# Patient Record
Sex: Female | Born: 1980 | Race: White | Hispanic: No | Marital: Married | Smoking: Never smoker
Health system: Southern US, Community
[De-identification: ages and names within clinical notes are randomized; demographics above are authoritative.]

## PROBLEM LIST (undated history)

## (undated) DIAGNOSIS — K219 Gastro-esophageal reflux disease without esophagitis: Secondary | ICD-10-CM

## (undated) HISTORY — PX: CHOLECYSTECTOMY: SHX55

## (undated) HISTORY — PX: ABDOMINAL HYSTERECTOMY: SHX81

## (undated) HISTORY — DX: Gastro-esophageal reflux disease without esophagitis: K21.9

---

## 2002-10-13 ENCOUNTER — Encounter (INDEPENDENT_AMBULATORY_CARE_PROVIDER_SITE_OTHER): Payer: Self-pay | Admitting: *Deleted

## 2002-10-13 ENCOUNTER — Ambulatory Visit (HOSPITAL_BASED_OUTPATIENT_CLINIC_OR_DEPARTMENT_OTHER): Admission: RE | Admit: 2002-10-13 | Discharge: 2002-10-13 | Payer: Self-pay | Admitting: Otolaryngology

## 2002-10-21 ENCOUNTER — Ambulatory Visit (HOSPITAL_COMMUNITY): Admission: RE | Admit: 2002-10-21 | Discharge: 2002-10-21 | Payer: Self-pay | Admitting: Cardiovascular Disease

## 2002-11-13 ENCOUNTER — Encounter: Admission: RE | Admit: 2002-11-13 | Discharge: 2002-11-13 | Payer: Self-pay | Admitting: Cardiovascular Disease

## 2002-11-13 ENCOUNTER — Encounter: Payer: Self-pay | Admitting: Cardiovascular Disease

## 2003-02-16 ENCOUNTER — Encounter: Payer: Self-pay | Admitting: Orthopedic Surgery

## 2003-02-16 ENCOUNTER — Ambulatory Visit (HOSPITAL_COMMUNITY): Admission: RE | Admit: 2003-02-16 | Discharge: 2003-02-16 | Payer: Self-pay | Admitting: Orthopedic Surgery

## 2003-03-03 ENCOUNTER — Ambulatory Visit (HOSPITAL_BASED_OUTPATIENT_CLINIC_OR_DEPARTMENT_OTHER): Admission: RE | Admit: 2003-03-03 | Discharge: 2003-03-03 | Payer: Self-pay | Admitting: Orthopedic Surgery

## 2003-05-13 ENCOUNTER — Ambulatory Visit (HOSPITAL_BASED_OUTPATIENT_CLINIC_OR_DEPARTMENT_OTHER): Admission: RE | Admit: 2003-05-13 | Discharge: 2003-05-13 | Payer: Self-pay | Admitting: Otolaryngology

## 2003-05-13 ENCOUNTER — Ambulatory Visit (HOSPITAL_COMMUNITY): Admission: RE | Admit: 2003-05-13 | Discharge: 2003-05-13 | Payer: Self-pay | Admitting: Otolaryngology

## 2003-05-13 ENCOUNTER — Encounter (INDEPENDENT_AMBULATORY_CARE_PROVIDER_SITE_OTHER): Payer: Self-pay | Admitting: Specialist

## 2003-06-07 ENCOUNTER — Emergency Department (HOSPITAL_COMMUNITY): Admission: EM | Admit: 2003-06-07 | Discharge: 2003-06-08 | Payer: Self-pay | Admitting: Emergency Medicine

## 2004-08-28 ENCOUNTER — Ambulatory Visit (HOSPITAL_COMMUNITY): Admission: RE | Admit: 2004-08-28 | Discharge: 2004-08-28 | Payer: Self-pay | Admitting: Internal Medicine

## 2005-01-02 ENCOUNTER — Other Ambulatory Visit: Admission: RE | Admit: 2005-01-02 | Discharge: 2005-01-02 | Payer: Self-pay | Admitting: Obstetrics and Gynecology

## 2005-05-22 ENCOUNTER — Ambulatory Visit (HOSPITAL_COMMUNITY): Admission: RE | Admit: 2005-05-22 | Discharge: 2005-05-22 | Payer: Self-pay | Admitting: Obstetrics and Gynecology

## 2005-06-14 ENCOUNTER — Inpatient Hospital Stay (HOSPITAL_COMMUNITY): Admission: AD | Admit: 2005-06-14 | Discharge: 2005-06-14 | Payer: Self-pay | Admitting: *Deleted

## 2005-07-18 ENCOUNTER — Inpatient Hospital Stay (HOSPITAL_COMMUNITY): Admission: AD | Admit: 2005-07-18 | Discharge: 2005-07-23 | Payer: Self-pay | Admitting: Obstetrics and Gynecology

## 2005-07-19 ENCOUNTER — Inpatient Hospital Stay (HOSPITAL_COMMUNITY): Admission: AD | Admit: 2005-07-19 | Discharge: 2005-07-23 | Payer: Self-pay | Admitting: Obstetrics & Gynecology

## 2006-01-24 ENCOUNTER — Inpatient Hospital Stay (HOSPITAL_COMMUNITY): Admission: AD | Admit: 2006-01-24 | Discharge: 2006-01-27 | Payer: Self-pay | Admitting: Obstetrics and Gynecology

## 2006-01-26 ENCOUNTER — Encounter: Payer: Self-pay | Admitting: General Surgery

## 2006-01-26 ENCOUNTER — Encounter (INDEPENDENT_AMBULATORY_CARE_PROVIDER_SITE_OTHER): Payer: Self-pay | Admitting: *Deleted

## 2006-07-09 ENCOUNTER — Inpatient Hospital Stay (HOSPITAL_COMMUNITY): Admission: AD | Admit: 2006-07-09 | Discharge: 2006-07-09 | Payer: Self-pay | Admitting: Obstetrics and Gynecology

## 2006-07-10 ENCOUNTER — Ambulatory Visit (HOSPITAL_COMMUNITY): Admission: RE | Admit: 2006-07-10 | Discharge: 2006-07-10 | Payer: Self-pay | Admitting: Obstetrics and Gynecology

## 2006-07-23 ENCOUNTER — Inpatient Hospital Stay (HOSPITAL_COMMUNITY): Admission: AD | Admit: 2006-07-23 | Discharge: 2006-07-24 | Payer: Self-pay | Admitting: Obstetrics & Gynecology

## 2006-08-22 ENCOUNTER — Inpatient Hospital Stay (HOSPITAL_COMMUNITY): Admission: AD | Admit: 2006-08-22 | Discharge: 2006-08-24 | Payer: Self-pay | Admitting: Obstetrics & Gynecology

## 2006-10-25 ENCOUNTER — Emergency Department (HOSPITAL_COMMUNITY): Admission: EM | Admit: 2006-10-25 | Discharge: 2006-10-25 | Payer: Self-pay | Admitting: Emergency Medicine

## 2006-11-25 ENCOUNTER — Encounter: Admission: RE | Admit: 2006-11-25 | Discharge: 2006-11-25 | Payer: Self-pay | Admitting: Cardiovascular Disease

## 2006-12-11 ENCOUNTER — Emergency Department (HOSPITAL_COMMUNITY): Admission: EM | Admit: 2006-12-11 | Discharge: 2006-12-11 | Payer: Self-pay | Admitting: Emergency Medicine

## 2008-03-01 ENCOUNTER — Observation Stay (HOSPITAL_COMMUNITY): Admission: AD | Admit: 2008-03-01 | Discharge: 2008-03-04 | Payer: Self-pay | Admitting: Cardiovascular Disease

## 2008-07-01 ENCOUNTER — Emergency Department (HOSPITAL_COMMUNITY): Admission: EM | Admit: 2008-07-01 | Discharge: 2008-07-01 | Payer: Self-pay | Admitting: Emergency Medicine

## 2010-09-12 LAB — DIFFERENTIAL
Basophils Relative: 1 % (ref 0–1)
Eosinophils Absolute: 0.2 10*3/uL (ref 0.0–0.7)
Monocytes Relative: 8 % (ref 3–12)
Neutrophils Relative %: 67 % (ref 43–77)

## 2010-09-12 LAB — BASIC METABOLIC PANEL
BUN: 11 mg/dL (ref 6–23)
CO2: 26 mEq/L (ref 19–32)
Chloride: 104 mEq/L (ref 96–112)
Creatinine, Ser: 0.69 mg/dL (ref 0.4–1.2)
Glucose, Bld: 90 mg/dL (ref 70–99)

## 2010-09-12 LAB — CBC
MCHC: 34.2 g/dL (ref 30.0–36.0)
MCV: 84.4 fL (ref 78.0–100.0)
Platelets: 216 10*3/uL (ref 150–400)
RDW: 13 % (ref 11.5–15.5)

## 2010-09-12 LAB — D-DIMER, QUANTITATIVE: D-Dimer, Quant: 0.23 ug/mL-FEU (ref 0.00–0.48)

## 2010-10-10 NOTE — Consult Note (Signed)
NAMELARAH, KUNTZMAN               ACCOUNT NO.:  [id number]   MEDICAL RECORD NO.:  [id number]          PATIENT TYPE:  OBV   LOCATION:  5024                         FACILITY:  [facility]   PHYSICIAN:  Sandria Bales. Ezzard Standing, M.D.  DATE OF BIRTH:  06-19-1980   DATE OF CONSULTATION:  03/02/2008  DATE OF DISCHARGE:                                 CONSULTATION   REQUESTING PHYSICIAN:  Ricki Rodriguez, MD   CONSULTING SURGEON:  Sandria Bales. Ezzard Standing, MD   REASON FOR CONSULTATION:  Abscess, right medial forearm.   PRESENT ILLNESS:  Cynthia Mendez is a 30 year old otherwise healthy female  patient who presented to the ER with complaints of a 3- to 4-day history  of some sort of insect bite or abscess on her right forearm.  The area  started a kind of small like a pimple, was tiny and nodular in nature,  but increased in size with increasing pain and redness.  When the  redness began extending up towards her olecranon fossa, she felt she  needed to present to the ER and she was admitted by Medicine and started  empirically on antibiotic therapy with Rocephin.  Since admission, she  has noticed decrease redness especially with the cellulitic tracking up  to the olecranon process.  She still has a swollen and tender area,  which is raised on the right forearm.  Surgical consultation has been  requested.   REVIEW OF SYSTEMS:  As per the history of present illness.  SKIN:  The patient does have a history of boils in the subaxilla and  breast region in the past.   SOCIAL HISTORY:  Denies alcohol or tobacco.   PAST MEDICAL HISTORY:  Denies.   PAST SURGICAL HISTORY:  1. Cholecystectomy by Dr. Zachery Dakins in 2008.  2. Prior left knee surgery.  3. Prior tonsillectomy.  4. Prior deviated septum, nasal surgery.   ALLERGIES:  NKDA.   MEDICATIONS AT HOME:  None.  Since admission, the patient has been  started on the following medications.  1. IV Rocephin 1 g daily.  2. Ibuprofen for pain.  3. Protonix.  4. Vicodin  for pain.  5. Ambien for hours for insomnia.  6. She has also been given some supplemental potassium due to mild      hypokalemia upon presentation.   PHYSICAL EXAMINATION:  GENERAL:  Pleasant female patient complaining of  continued pain and swelling in the right forearm.  They are improved as  compared to prior to admission.  VITAL SIGNS:  Temp 97.6, BP 99/64, pulse 93 and regular, respirations  22.  PSYCH:  The patient is alert and oriented x3.  Her affect is appropriate  to current situation.  NEURO:  Cranial nerves II through XII are grossly intact.  She is moving  all extremities x4.  She is ambulatory.  CHEST:  Bilateral lung sounds are clear to auscultation.  Respiratory  effort is nonlabored.  She is on room air.  CARDIOVASCULAR:  Heart sounds are S1 and S2 without rubs, murmurs,  thrills, or gallops.  No JVD.  No peripheral edema.  Her  distal pulses  are intact.  Radial, femoral, and pedal including on the right forearm,  both ulnar and radial pulses are easily palpable, 2+.  ABDOMEN:  Benign, is soft.  Bowel sounds are present.  No organomegaly.  EXTREMITIES:  Symmetrical in appearance without cyanosis or clubbing.  SKIN:  She does have in the right medial forearm, a large bolus/boil-  type lesion that is below the skin.  This is circumferential in  appearance about 0.5-0.75 cm in height.  It is barely fluctuant, is red  and measures about 2 x 2 cm total diameter.  There is a tiny dark area  at the center.  No drainage.  She has a subtle area of raised erythema  surrounding the boil that actually extends out from the distal aspect of  the boil going medial towards the olecranon process.  This area is  estimated to be about 3 x 4 cm and is oval shaped.   LABORATORY DATA:  White count 8100, hemoglobin 13.8, platelets 220,000.  Sodium 135, potassium 3.4, CO2 26, glucose 114, BUN 12, creatinine 0.75.   IMPRESSION:  Right medial forearm abscess.   PLAN:  1. We will most  likely consider bedside I&D and culture.  I will defer      this to Dr. Ezzard Standing pending his examination.  2. She is clinically improving with medical therapy.  This may improve      without I&D, but given the size of the area, she again would      probably benefit from bedside I&D.  In the meantime, I will      continue empiric antibiotics, may wish to broaden coverage to cover      community-acquired MRSA such as doxycycline or Septra.      Cynthia Mendez, N.P.      Sandria Bales. Ezzard Standing, M.D.  Electronically Signed    ALE/MEDQ  D:  03/02/2008  T:  03/03/2008  Job:  [id number]   cc:   Ricki Rodriguez, M.D.

## 2010-10-10 NOTE — Discharge Summary (Signed)
NAMETRUDI, MORGENTHALER               ACCOUNT NO.:  [id number]   MEDICAL RECORD NO.:  [id number]          PATIENT TYPE:  OBV   LOCATION:  5024                         FACILITY:  [facility]   PHYSICIAN:  Ricki Rodriguez, M.D.  DATE OF BIRTH:  03-Jan-1981   DATE OF ADMISSION:  03/01/2008  DATE OF DISCHARGE:  03/04/2008                               DISCHARGE SUMMARY   FINAL DIAGNOSIS:  1. Right forearm abscess.  2. Possible insect bite.   DISCHARGE MEDICATIONS:  1. Doxycycline 100 mg one twice daily.  2. Percocet 5/325 mg one or two tablet every 4 hour as needed for      pain.  3. Ibuprofen 600 mg 3 times daily as needed for pain, may take in      addition to Percocet and always take with food or snack.   DISCHARGE DIET:  Low-sodium heart-healthy diet.   DISCHARGE ACTIVITY:  The patient to increase activity slowly.   WOUND CARE INSTRUCTION:  1. The patient to cover the wound with thin clean patch of oil      emulsion 2 times daily, warm compress or showers soaker 2 to 3      times daily.  2. Follow up by [facility] in 4 to 5 days.  The patient      to call [phone] for appointment.   HISTORY:  This 30 year old white female had possible insect bite 3 days  ago.  This was then followed by increasing redness, swelling in the  forearm, some of the redness and pain was following venous markings of  the forearm.  The patient does not have any past history of diabetes,  hypertension, smoking, or alcohol intake.   PHYSICAL EXAMINATION:  VITAL SIGNS:  Temperature 96, pulse 87,  respirations 16, blood pressure 106/80, height 5 feet 4 inch, and weight  159 pounds.  GENERAL:  The patient is alert and oriented x3.  She is averagely built  and nourished.  HEENT:  The patient is normocephalic and atraumatic.  Has blue eyes.  Pupils equally reactive to light.  Ear, nose, and throat grossly normal.  NECK:  No JVD, no carotid.  LUNGS:  Clear bilaterally.  HEART:  Normal S1 and S2.  No  murmur, gallop, or rub.  ABDOMEN:  Soft.  EXTREMITIES:  No edema, cyanosis, or clubbing.  However, had a 2.5 cm,  mid forearm, ventral, tender swelling with redness extending towards  elbow.  SKIN:  Warm and dry.  NEUROLOGICALLY:  The patient moves all four extremities and cranial  nerves are grossly intact.   LABORATORY DATA:  Revealed normal hemoglobin/hematocrit, WBC count, and  platelet count.  Electrolytes were near normal with a potassium of 3.4.  Blood cultures were pending.   HOSPITAL COURSE:  The patient was started on IV Rocephin following blood  cultures and surgical consult for Dr. Ezzard Standing was obtained.  The patient  underwent abscess incision and drainage as a bedside procedure.  The  patient had a mild elevation of temperature for 24 hours.  Doxycycline  100 mg IV q.12 h. was given.  Her condition  improved in 48-72 hours.  Daily dressing changes were done and the patient was afebrile on March 04, 2008, and discharged home in satisfactory condition with followup by  surgery in 4-5 days.      Ricki Rodriguez, M.D.  Electronically Signed     ASK/MEDQ  D:  03/04/2008  T:  03/05/2008  Job:  [id number]

## 2010-10-10 NOTE — Op Note (Signed)
NAMEMACAILA, TAHIR               ACCOUNT NO.:  [id number]   MEDICAL RECORD NO.:  [id number]          PATIENT TYPE:  OBV   LOCATION:  5024                         FACILITY:  [facility]   PHYSICIAN:  Sandria Bales. Ezzard Standing, M.D.  DATE OF BIRTH:  1981-01-12   DATE OF PROCEDURE:  03/02/2008  DATE OF DISCHARGE:                               OPERATIVE REPORT   Date of surgery ??   PREOPERATIVE DIAGNOSES:  Abscess, right forearm.   POSTOPERATIVE DIAGNOSES:  Abscess, right forearm.   PROCEDURE:  Incision and drainage of right forearm abscess.   ANESTHESIA:  Approximately 12 mL of 1% Xylocaine.   COMPLICATIONS:  None.   INDICATIONS FOR PROCEDURE:  Ms. Amrhein is a 30 year old white female who  has had an abscess come up on her right forearm and has tenderness,  swelling, and redness.  I plan to incise and drain this abscess at the  bedside and she is in the [facility] room 5024.   PROCEDURE NOTE:  The right arm was painted with Betadine solution,  sterile draped, infiltrate with about 12 mL of 1% Xylocaine.  I then did  an incision and drainage in to the abscess  and got some pus out that I  cultured.  I packed the wound with iodoform gauze.   The patient tolerated the procedure well.  We will start soaks to this  arm tomorrow.  She is already on antibiotics.      Sandria Bales. Ezzard Standing, M.D.  Electronically Signed     DHN/MEDQ  D:  03/02/2008  T:  03/03/2008  Job:  [id number]   cc:   Ricki Rodriguez, M.D.

## 2010-10-13 NOTE — Op Note (Signed)
NAME:  Cynthia Mendez, Cynthia Mendez                       ACCOUNT NO.:  [id number]   MEDICAL RECORD NO.:  [id number]                   PATIENT TYPE:  AMB   LOCATION:  DSC                                  FACILITY:  [facility]   PHYSICIAN:  Hermelinda Medicus, M.D.                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   OPERATION:  Septal reconstruction and turbinate reduction.   SURGEON:  Hermelinda Medicus, M.D.   ANESTHESIA:  General endotracheal by Dr. Gelene Mink   PROCEDURE:  The patient was placed in the supine position.  Under general  endotracheal anesthesia, the nose was also anesthetized with 1% Xylocaine  with epinephrine and topical cocaine 200 mg.  The inferior turbinates were  aggressively outfractured but no mucous membrane was removed gaining  considerable space.  A hemitransfixion incision was made on the left side,  carried back along the quadrilateral cartilage and the deviated  quadrilateral cartilage posteriorly and anterior septal deviation was  corrected by removing a strip of cartilaginous quadrilateral cartilage from  the posterior and from the floor of the nose.  A 4 mm chisel was used to  take down the premaxillary crest deviation and to bring that portion of the  septum back to the midline.  We then used the open and closed Laren Boom and [place] forceps to correct the ethmoid deviation that was to  the left.  Once this was brought under control, the septum was established  on the premaxillary crest of the midline and closure was begun using 5-0  plain catgut through and through septal suture using 4-0 plain x 2.  A nasal  pack was placed.   The patient tolerated the procedure well and is doing well  postoperatively.  Follow up will be tomorrow for packing removal and in one  week, three weeks, six weeks, and three months.                                               Hermelinda Medicus, M.D.    JC/MEDQ  D:  05/13/2003  T:  05/13/2003  Job:  [id number]   cc:   Ricki Rodriguez, M.D.  [address]  [place] [address]   Gabriel Earing, M.D.  [address]  [place]  [place] [address]  Fax: [phone]

## 2010-10-13 NOTE — H&P (Signed)
NAME:  Cynthia Mendez, Cynthia Mendez                       ACCOUNT NO.:  [id number]   MEDICAL RECORD NO.:  [id number]                   PATIENT TYPE:  AMB   LOCATION:  DSC                                  FACILITY:  [facility]   PHYSICIAN:  Hermelinda Medicus, M.D.                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  She  has had also persistent headache associated with this.  She is only 30 years  old, has been on a considerable amount of Duratuss and decongestant and her  only other medication is that of birth control pills.  She has no allergies  to medication and apparently has no allergies generally but she has entered  my office with considerable erythema of her nose with drainage down the back  of her posterior nasal pharynx into her throat which is bright red.  She, in  the past, has had similar problems and has been on Z-PAK.  Has also been on  Augmentin for two weeks trying to get over the infection after that and then  was on Ketek.  The headache problem was seen and a CT of the head was done  which was found to be within normal limits.  The sinuses appeared to be able  to be cleared as well as the mastoid appeared to be in good condition.  She  has also been on Prolex-PD, Tessalon Perles for cough and once we get her  over the infection is trying to keep her over.  On November 22 she finished  up her Ketek and by December she was back on Levaquin.  The only other  finding is that she has quite a severe septal deviation, has been unable to  breathe well through her nose.  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  She has also worked at [employer]  She has been associated with  several sick people and with people with pneumonia.   ALLERGIES:   No allergies to medications.   PAST SURGICAL HISTORY:  1. She had a tonsillectomy in March 2004.  2. Knee scope October 2004.   PAST MEDICAL HISTORY:  Otherwise, she is in perfectly good health except the  headache, the sinusitis, and the nasal obstruction.   PHYSICAL EXAMINATION:  GENERAL:  She is a well-nourished, well-developed  female in no acute distress.  VITAL SIGNS:  Blood pressure 108/68, pulse 61, weight 130.  HEENT:  Her ears are clear.  Tympanic membranes are clear.  Oral cavity is  clear.  Her nose shows a septal deviation to the left in the anterior  ethmoid/posterior ______ cartilage and it is off the premaxillary crest on  the left side of her nose.  Turbinates are quite large.  The oral cavity is  clear, free of any ulceration or mass.  Larynx, true vocal cords, false  cords, epiglottis, base of tongue are clear.  CHEST:  Clear.  No rales, rhonchi, or wheezes.  CARDIOVASCULAR:  Normal S1 and S2, no murmurs, rubs, or gallops.  ABDOMEN:  Unremarkable.  EXTREMITIES:  Unremarkable except for previous knee surgery.   INITIAL DIAGNOSES:  1. History sinusitis.  2. History persistent headaches.  3. History of repetitive sinusitis with nasal obstruction.  She now enters     for a septal reconstruction, turbinate reduction under general     endotracheal anesthesia.                                                Hermelinda Medicus, M.D.    JC/MEDQ  D:  05/13/2003  T:  05/13/2003  Job:  [id number]   cc:   Gabriel Earing, M.D.  [address]  [place]  [place] [address]  Fax: [phone]   Ricki Rodriguez, M.D.  [address]  [place] [address]

## 2010-10-13 NOTE — Op Note (Signed)
NAME:  Cynthia Mendez, Cynthia Mendez                       ACCOUNT NO.:  [id number]   MEDICAL RECORD NO.:  [id number]                   PATIENT TYPE:  AMB   LOCATION:  DSC                                  FACILITY:  [facility]   PHYSICIAN:  Jefry H. Pollyann Kennedy, M.D.                DATE OF BIRTH:  05-23-1981   DATE OF PROCEDURE:  10/13/2002  DATE OF DISCHARGE:                                 OPERATIVE REPORT   PREOPERATIVE DIAGNOSIS:  Chronic tonsillitis with a tonsil mass.   POSTOPERATIVE DIAGNOSIS:  Chronic tonsillitis with a tonsil mass.   PROCEDURE:  Tonsillectomy.   SURGEON:  Jefry H. Pollyann Kennedy, M.D.   ANESTHESIA:  General endotracheal anesthesia.   COMPLICATIONS:  None.   ESTIMATED BLOOD LOSS:  None.   FINDINGS:  Diffuse enlargement of the tonsils bilaterally with a  granulomatous-appearing lesion of the superior pole of the right tonsil.  The patient tolerated the procedure well, was awakened, extubated, and  transferred to recovery in stable condition.   REFERRING PHYSICIAN:  Ricki Rodriguez, M.D.   HISTORY:  This is a 30 year old with a history of chronic and recurring  tonsillitis.  The risks, benefits, alternatives, and complications of the  procedure were explained to the patient, who seemed to understand and agreed  to surgery.   DESCRIPTION OF PROCEDURE:  The patient was taken to the operating room and  placed on the operating table in supine position.  Following induction of  general endotracheal anesthesia, the Crowe-Davis mouth gag was inserted into  the oral cavity, used to retract the tongue and mandible, and attached to  the Mayo stand.  Inspection of the palate revealed no evidence of a short  palate or submucous cleft.  A red rubber catheter was inserted into the  right side of the nose, withdrawn through the mouth, and used to retract the  soft palate and uvula.  Indirect exam of the nasopharynx was performed.  There was no appreciable adenoid tissue present.  The tonsils  were removed  using electrocautery dissection, carefully dissecting the avascular plane  between the tonsil capsule and the constrictor muscles.  Spot cautery was  used as needed for small bleeders.  The tonsils were sent separately for  pathologic  evaluation because of the granulomatous lesion on the right side.  The  pharynx was suctioned of secretions and irrigated with saline and an  orogastric tube was used to aspirate the contents of the stomach.  The  patient was then awakened, extubated, and transferred to recovery in good  condition.                                               Jefry H. Pollyann Kennedy, M.D.    JHR/MEDQ  D:  10/13/2002  T:  10/13/2002  Job:  [id number]   cc:   Ricki Rodriguez, M.D.  [address]  [place] [address]

## 2010-10-13 NOTE — Op Note (Signed)
   NAME:  Cynthia Mendez, Cynthia Mendez                       ACCOUNT NO.:  [id number]   MEDICAL RECORD NO.:  [id number]                   PATIENT TYPE:  AMB   LOCATION:  DSC                                  FACILITY:  [facility]   PHYSICIAN:  Mila Homer. Sherlean Foot, M.D.              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   PROCEDURE:  Left knee partial medial meniscectomy and plica debridement.   INDICATION FOR PROCEDURE:  The patient is a 30 year old white female with  MRI evidence of a cartilage tear and symptomatic evidence of a plica.  Informed consent was obtained.   DESCRIPTION OF PROCEDURE:  The patient was laid supine and administered  general anesthesia.  The left lower extremity was prepped and draped in the  usual sterile fashion.  Inferolateral and inferomedial portals were created  with a #11 blade, blunt trocar, and cannula.  Diagnostic arthroscopy  revealed a normal patellofemoral joint other than a very hard and thickened  medial patellofemoral plica impinging through an arc of motion.  I used a  Automatic Data shaver to remove this plica.  I then went into flexion in  valgus.  There was a very obvious oblique posterior horn tear, which was  obviously the main source of her pain.  I used a Biochemist, clinical and  small basket forceps to perform an aggressive partial medial meniscectomy.  Once the cartilage was debrided back to a stable rim of tissue, I went and  checked the ACL and PCL, which were normal, the lateral compartment, which  was normal, and went back into the patellofemoral joint to ensure that the  plica was adequately debrided.  I then lavaged the knee, removed the fluid  and instruments, and closed with interrupted 4-0 nylon sutures,  dressed with  Adaptic, 4 x 4's, sterile Webril, and Ace wrap, and I did infiltrate the  portals with 5 mL of 0.5% Marcaine and morphine mixture in each portal.                                               Mila Homer. Sherlean Foot, M.D.    SDL/MEDQ  D:  03/03/2003  T:  03/03/2003  Job:  [id number]

## 2010-10-13 NOTE — Op Note (Signed)
NAME:  Cynthia Mendez, Cynthia Mendez NO.:  [id number]   MEDICAL RECORD NO.:  [id number]          PATIENT TYPE:  OUT   LOCATION:  DAY                          FACILITY:  [facility]   PHYSICIAN:  Anselm Pancoast. Weatherly, M.D.DATE OF BIRTH:  [birth year]   DATE OF PROCEDURE:  DATE OF DISCHARGE:  01/27/2006                                 OPERATIVE REPORT   PREOPERATIVE DIAGNOSES:  1. Chronic cholecystitis with possible recent passage of common bile duct      stone.  2. Intrauterine pregnancy.   POSTOPERATIVE DIAGNOSES:  1. Chronic cholecystitis with possible recent passage of common bile duct      stone.  2. Intrauterine pregnancy.   OPERATION PERFORMED:  Laparoscopic cholecystectomy with cholangiogram.   SURGEON:  Anselm Pancoast. Zachery Dakins, M.D.   ANESTHESIA:  General anesthesia.   HISTORY:  Cynthia Mendez is a 30 year old Caucasian female who was admitted  over at [facility] with epigastric and abdominal pain radiating to the  small of her back.  Her liver function studies were abnormal.  She is only  seven months postpartum, but she is now five weeks pregnant with a second  pregnancy.  An ultrasound showed numerous stones within her gallbladder.  The stone are small.  When she was originally seen at [facility] her liver  function studies were abnormal with an SGOT of 300 and SGOT __________  and  bilirubin was normal. The liver tests rose to an SGOT of 940 and an SGPT of  1,217.  An ultrasound was performed, which showed that the uterus has a  viable fetus.   The patient was seen by Dr. Bosie Clos of [facility] who felt that  with her symptoms she needed surgery consisting of a cholecystectomy.  She  may have passed the common bile duct stone since her liver function tests  appeared to be dropping; and that she did not need a preoperative endoscopic  retrograde cholangiopancreatography.  I was in agreement Dr. Marge Duncans  opinion.   Dr. Arlyce Dice saw the patient and she  is well aware that the surgery at this  time may cause her to have a miscarriage.  __________  with the acute  symptoms, etc we felt that we had no alternatives.   Arrangements were made to transfer her to [facility]; we will follow her in  the morning for the planned surgery.   DESCRIPTION OF THE OPERATION:  The patient was brought to surgery and  induction of general anesthesia was given.  The patient had been give  preoperative antibiotics.  The laparoscope, after prepping the abdomen, was  inserted through a small in below the umbilicus.  A purse-string suture had  been placed and the gallbladder was tight, but not acutely inflamed.  The  cystic duct was identified and dissected out.  A clip was placed distally  and a Cook catheter  __________ .  X-ray was obtained, which did show two  small stones in the distal common bile duct with good flow into the  duodenum.   The cystic artery was doubly clipped proximally, singly and distally  divided.  The gallbladder  was removed.  The clips had been placed on the  proximal cystic duct; and, no drains were placed.   The umbilical fascia was closed with an additional figure-of-eight of 0-  Vicryl in addition to the purse-string that had been placed.  The  subcutaneous branch was closed with 4-0 Vicryl.  Benzoin and Steri-strips  were placed on the skin.   DISCHARGE INSTRUCTIONS:  The patient, postoperatively, will have repeat  liver function studies in the morning.  I will talk with Dr. Bosie Clos. Thi  is certainly not a good time to have to do an endoscopic retrograde  cholangiopancreatography; and, the stones are small and hopefully they will  pass.   The patient tolerated the procedure nicely and will be kept this evening as  planned.           ______________________________  Anselm Pancoast. Zachery Dakins, M.D.     WJW/MEDQ  D:  02/20/2006  T:  02/22/2006  Job:  [id number]   cc:   Ilda Mori, M.D.  Fax: [phone]   Shirley Friar, MD  Fax: [phone]

## 2010-10-13 NOTE — Consult Note (Signed)
NAME:  Cynthia Mendez, Cynthia Mendez NO.:  [id number]   MEDICAL RECORD NO.:  [id number]          PATIENT TYPE:  INP   LOCATION:  9314                          [facility]ILITY[facility]  [facility]   PHYSICIAN:  Shirley Friar, MDDATE OF BIRTH:  Nov 08, 1980   DATE OF CONSULTATION:  DATE OF DISCHARGE:                                   CONSULTATION   PHYSICIAN CALLING FOR CONSULT:  Dr. Arlyce Dice   CHIEF COMPLAINT:  Right upper quadrant pain for the past 8 days.   HISTORY OF PRESENT ILLNESS:  The patient reports pain in the diaphragmatic  area anteriorly that radiates to the right back/shoulder area.  It started 1  week ago Thursday.  The pain awakens her at 4 a.m. in the morning and lasts  for hours.  She reports that she doubles over in pain.  Nothing she tries  relieves the pain.  She has tried Flexeril, Mylanta, Nexium, hot baths,  heating pads, changes in position, and nothing relieves the pain.  The pain  has occurred 5-6 nights in the past week to 8 days.  She has vomited twice.  She reports loose stools over the past week.  The patient also reports that  she felt a similar pain with her previous pregnancy that became severe just  before delivery.   PAST MEDICAL HISTORY:  She has sinusitis, has had a nasal obstruction.  She  had a premature delivery July 23, 1998.  She delivered at 38 weeks, a  female, and she delivered early because of preeclampsia.  Her blood type is  A negative.   SURGERIES:  Include a tonsillectomy in 2004 and arthroscope of the knee in  2004.   MEDICATIONS:  None at home.   ALLERGIES:  No known drug allergies.   SOCIAL:  She lives at home with her husband and infant daughter.   FAMILY HISTORY:  Significant for gallbladder disease in her mother, her  grandmother, and her aunt.   REVIEW OF SYSTEMS:  No headache, no swelling, no dyspnea or shortness of  breath, no anorexia.   PHYSICAL EXAMINATION:  GENERAL:  She is sitting up in bed.  She is alert and  oriented in no apparent distress.  She appears hydrated, well-nourished,  well-developed.  HEENT:  Eyes are PERRL.  They are anicteric.  Conjunctivae are pink.  NECK:  Supple with no thyromegaly, no adenopathy.  HEART:  Has a regular rate and rhythm with no murmurs, rubs, or gallops  appreciated.  LUNGS:  Clear to auscultation with no wheezes, crackles, or rales  appreciated.  ABDOMEN:  Soft, nontender, nondistended, with positive bowel sounds, no  bruits.  She has a negative Murphy's and negative peritoneal signs.  EXTREMITIES:  Show no cyanosis or edema.  VITAL SIGNS:  Temperature 97.7, pulse is 83, respirations are 20, and her  blood pressure is 109/65.   Her CBC showed a white count of 3.7, her hemoglobin is 12.1, hematocrit is  35.5, and platelets 267.  Chem 7 showed sodium 139, potassium 3.2, chloride  108, bicarb 28, BUN 1, creatinine 0.6, glucose is 93.  GI enzymes:  Her AST  is 946, her ALT is 1217, her alk phos is 96, her total bilirubin is 1, her  amylase is 68, lipase is 44.  Hepatitis B surface antigen is negative,  hepatitis C is negative, and the rest of an acute hepatitis panel is still  pending.  On ultrasound she had cholelithiasis.  The common bile duct was  2.2 mm which is within normal limits.   ASSESSMENT AND PLAN:  This is a 30 year old pregnant female with severe  episodic pain in the area of the diaphragm anteriorly radiating to the right  shoulder over 8 days.  The patient has elevated liver enzymes, vomiting, and  loose stools.  She does not have a rise in her amylase, she is afebrile, she  has no white count.  Common bile duct is 2.2 mm on ultrasound.  Ultrasound  shows cholelithiasis.  Her history is not consistent with an obstructing CBD  stone since there is no duct dilation and ALP is normal.  She may have  passed a stone leading to the sharp rise in AST/ALT but I think she needs to  have a cholecystectomy with intraoperative cholangiogram. If the IOC  is  positive then we would be happy to do an ERCP.  Will discuss this with the  surgeons and Dr. Arlyce Dice.   Thank you very much for this consultation.      Stephani Police, [place]      Shirley Friar, MD  Electronically Signed    MLY/MEDQ  D:  01/25/2006  T:  01/26/2006  Job:  [id number]   cc:   Carrington Clamp, M.D.  Fax: [phone]   Velora Heckler, MD  [phone] [address]  [place]  [place] [address]   Ilda Mori, M.D.  Fax: [phone]

## 2010-10-13 NOTE — Consult Note (Signed)
NAME:  Cynthia Mendez, Cynthia Mendez NO.:  [id number]   MEDICAL RECORD NO.:  [id number]          PATIENT TYPE:  INP   LOCATION:  9314                          FACILITY:  [facility]   PHYSICIAN:  Anselm Pancoast. Weatherly, M.D.DATE OF BIRTH:  07/28/1980   DATE OF CONSULTATION:  01/25/2006  DATE OF DISCHARGE:                                   CONSULTATION   CHIEF COMPLAINT:  Abdominal pain and abnormal liver tests.   HISTORY:  Cynthia Mendez is a 30 year old Caucasian female who was admitted  yesterday. She is now approximately 5-weeks pregnant. She is 7 months  postpartum. She was admitted with intermittent episodes of right upper  quadrant and epigastric pain. Her liver function studies were abnormal and  an ultrasound of the gallbladder was obtained which showed a very large  gallbladder with numerous small stones within it, the common bile duct was  not dilated and laboratory studies yesterday showed an SGOT of 350, an SGPT  of 153, alkaline phosphatase of 54 and a bilirubin 0.9. She was admitted and  a hepatitis panel was performed, it also did an amylase, I do not have the  results, but reportedly it was normal. Today I think her pain is actually  less. Her liver studies however showed an SGOT of 946, an SGPT of 1217 and a  white count was upper limits of normal yesterday is normal today at 3.7  thousand.  The patient has been seen by Dr. Arlyce Dice. Pelvic ultrasounds have  been performed and questioned whether she is threatening a miscarriage.  Dr.  Gerrit Friends was asked to see her, being on call, but he asked me to see her  because of his very busy schedule today and I agreed to see her.   PHYSICAL EXAMINATION:  GENERAL:  At this time she is a pleasant, slightly  overweight young female who is eating a clear liquid diet and greatly  desiring solid food. She says she still has kind of mild epigastric pain in  the right upper quadrant but she is not acutely tender, and she is also   afebrile.   Dr. Bosie Clos of [facility] had seen the patient and he left a  note stating that he does not feel she needs preoperative ERCP. She may have  passed a small common duct stone. Also in my opinion was this very dilated  gallbladder she has it may be extrinsic pressure from the dilated  gallbladder with a little bit of Mirizzi's type syndrome that is causing the  abnormal liver tests since her intrahepatic bile duct was not elevated on  the x-ray yesterday.   I would recommend, in that this is the third episode of pain that she had  over a period of approximately a month, that she is going to certainly need  a laparoscopic cholecystectomy and cholangiogram and the patient says she  desires to proceed with surgery at this admission and not try to postpone it  for approximately 6 weeks at which time she would hopefully be into the  second trimester. Dr. Arlyce Dice was present and he has discussed with her that  the ultrasound of her uterus does show possibly a threatened miscarriage but  she has not had any vaginal bleeding. The patient is scheduled for a CT and  quantitative in the morning, and he will make a note whether they are still  concerned about possibly having a miscarriage and feels that it would  probably be okay to proceed on with surgery at this time instead of waiting  until the second trimester.  The patient said she would greatly prefer  proceeding now, and her family members were present and I think understand  what we are talking about with the risk of surgery with a very early  pregnancy.  Dr. Arlyce Dice says he will write a note to the effect the course of  this surgery can be done at [facility]. We can shield the lower  abdomen at the time of her x-ray to make sure there will be no radiation  exposure to the fetus but the anesthetic risk would possibly cause a  miscarriage.  The patient is not allergic  to penicillin and we are going to proceed  with surgery tomorrow.  [facility] and probably keep her up there overnight and then release her  the following morning provided ERCP is not needed to be performed by Dr.  Bosie Clos or one of his associates.           ______________________________  Anselm Pancoast. Zachery Dakins, M.D.     WJW/MEDQ  D:  01/25/2006  T:  01/25/2006  Job:  [id number]

## 2010-10-13 NOTE — Discharge Summary (Signed)
NAME:  Cynthia Mendez, SHAKIR NO.:  [id number]   MEDICAL RECORD NO.:  [id number]          PATIENT TYPE:  INP   LOCATION:  9126                          FACILITY:  [facility]   PHYSICIAN:  Gerrit Friends. Aldona Bar, M.D.   DATE OF BIRTH:  October 30, 1980   DATE OF ADMISSION:  07/19/2005  DATE OF DISCHARGE:  07/23/2005                                 DISCHARGE SUMMARY   DISCHARGE DIAGNOSIS:  1.  36 week intrauterine pregnancy, delivered 5 pound 9 ounce female infant,      Apgars 07/09.  2.  Blood type A negative - RhoGAM given.  3.  Preeclampsia - the reason for induction of labor.   PROCEDURES:  1.  Induction of labor.  2.  Normal spontaneous delivery.   SUMMARY:  This patient, a 30 year old primigravida with a due date of March  23 was admitted for induction on July 19, 2005. She has had excess  weight gain, had proteinuria, had edema and her blood pressures were  elevated. She was admitted. Induction was begun with Cytotec and on the  morning of 07/20/2005 she was 4-5 cm dilated, vertex -2 station. She  subsequent delivered at about 11:00 a.m. on 07/20/2005. Mother was placed on  magnesium sulfate postpartum and transferred to the intensive care unit for  observation. Her blood pressures did well and after 24 hours magnesium  sulfate was discontinued. There was an episode of postpartum fever which was  treated with ampicillin and gentamicin but on the morning of 07/23/2005  mother was afebrile. Her blood pressure was 130/88. She was alert,  ambulating, bottle-feeding although her breasts were engorged, comfortable  and desirous of discharge. Accordingly she was given all appropriate  instructions and understood all instructions well. She was begun on  hydrochlorothiazide 25 milligrams every morning for the next week or two  because of continued edema and slightly elevated blood pressure - and she  was bottle feeding. She was also given prescriptions for Tylox use 1-2 every  4-6 hours  as needed for severe pain and Motrin 600 milligrams use every 6  hours as needed for cramping. She will return the office follow-up in  approximately one weeks' time to monitor her blood pressure and make a  decision about hydrochlorothiazide. She will continue on her prenatal  vitamins and she will add Feosol capsules one a day as her discharge  hemoglobin was 9.1 with a white count of 16,800 and platelet count of  186,000 (done on 07/22/2005).   CONDITION ON DISCHARGE:  Improved.      Gerrit Friends. Aldona Bar, M.D.  Electronically Signed     RMW/MEDQ  D:  07/23/2005  T:  07/23/2005  Job:  [id number]

## 2011-02-26 LAB — CULTURE, ROUTINE-ABSCESS: Gram Stain: NONE SEEN

## 2011-02-26 LAB — BASIC METABOLIC PANEL
CO2: 26
Calcium: 9
Creatinine, Ser: 0.75
GFR calc Af Amer: >60

## 2011-02-26 LAB — DIFFERENTIAL
Basophils Absolute: 0
Eosinophils Absolute: 0.1
Lymphocytes Relative: 24
Neutrophils Relative %: 48

## 2011-02-26 LAB — CBC
MCHC: 34.4
MCHC: 34.5
Platelets: 172
RBC: 4.79
RDW: 13.4

## 2011-02-26 LAB — CULTURE, BLOOD (ROUTINE X 2)

## 2011-03-12 LAB — DIFFERENTIAL
Basophils Relative: 0
Lymphs Abs: 1.9
Monocytes Relative: 8
Neutro Abs: 7.7
Neutrophils Relative %: 72

## 2011-03-12 LAB — D-DIMER, QUANTITATIVE: D-Dimer, Quant: 0.61 — ABNORMAL HIGH

## 2011-03-12 LAB — I-STAT 8, (EC8 V) (CONVERTED LAB)
BUN: 16
Glucose, Bld: 103 — ABNORMAL HIGH
Potassium: 3.7
TCO2: 25
pH, Ven: 7.398 — ABNORMAL HIGH

## 2011-03-12 LAB — POCT CARDIAC MARKERS
Myoglobin, poc: 30.1
Operator id: 282201
Troponin i, poc: <0.05

## 2011-03-12 LAB — CBC
RBC: 4.4
WBC: 10.7 — ABNORMAL HIGH

## 2011-03-12 LAB — POCT I-STAT CREATININE: Operator id: 282201

## 2014-11-30 ENCOUNTER — Emergency Department (HOSPITAL_COMMUNITY)
Admission: EM | Admit: 2014-11-30 | Discharge: 2014-11-30 | Disposition: A | Discharge status: Home / Self Care | Payer: BLUE CROSS/BLUE SHIELD | Attending: Emergency Medicine | Admitting: Emergency Medicine

## 2014-11-30 ENCOUNTER — Emergency Department (HOSPITAL_COMMUNITY): Payer: BLUE CROSS/BLUE SHIELD

## 2014-11-30 ENCOUNTER — Encounter (HOSPITAL_COMMUNITY): Payer: Self-pay | Admitting: *Deleted

## 2014-11-30 DIAGNOSIS — R1012 Left upper quadrant pain: Secondary | ICD-10-CM | POA: Diagnosis not present

## 2014-11-30 DIAGNOSIS — R112 Nausea with vomiting, unspecified: Secondary | ICD-10-CM | POA: Diagnosis not present

## 2014-11-30 DIAGNOSIS — Z79899 Other long term (current) drug therapy: Secondary | ICD-10-CM | POA: Diagnosis not present

## 2014-11-30 DIAGNOSIS — R079 Chest pain, unspecified: Secondary | ICD-10-CM | POA: Diagnosis not present

## 2014-11-30 LAB — CBC WITH DIFFERENTIAL/PLATELET
BASOS ABS: 0 10*3/uL (ref 0.0–0.1)
Basophils Relative: 0 % (ref 0–1)
EOS PCT: 14 % — AB (ref 0–5)
Eosinophils Absolute: 1 10*3/uL — ABNORMAL HIGH (ref 0.0–0.7)
HCT: 41.8 % (ref 36.0–46.0)
HEMOGLOBIN: 14 g/dL (ref 12.0–15.0)
LYMPHS ABS: 2.1 10*3/uL (ref 0.7–4.0)
Lymphocytes Relative: 28 % (ref 12–46)
MCH: 27.6 pg (ref 26.0–34.0)
MCHC: 33.5 g/dL (ref 30.0–36.0)
MCV: 82.4 fL (ref 78.0–100.0)
MONOS PCT: 11 % (ref 3–12)
Monocytes Absolute: 0.8 10*3/uL (ref 0.1–1.0)
Neutro Abs: 3.6 10*3/uL (ref 1.7–7.7)
Neutrophils Relative %: 47 % (ref 43–77)
Platelets: 281 10*3/uL (ref 150–400)
RBC: 5.07 MIL/uL (ref 3.87–5.11)
RDW: 13.2 % (ref 11.5–15.5)
WBC: 7.5 10*3/uL (ref 4.0–10.5)

## 2014-11-30 LAB — COMPREHENSIVE METABOLIC PANEL
ALT: 23 U/L (ref 14–54)
AST: 23 U/L (ref 15–41)
Albumin: 3.7 g/dL (ref 3.5–5.0)
Alkaline Phosphatase: 75 U/L (ref 38–126)
Anion gap: 8 (ref 5–15)
BUN: 9 mg/dL (ref 6–20)
CALCIUM: 8.9 mg/dL (ref 8.9–10.3)
CO2: 25 mmol/L (ref 22–32)
CREATININE: 0.83 mg/dL (ref 0.44–1.00)
Chloride: 105 mmol/L (ref 101–111)
GFR calc non Af Amer: >60 mL/min (ref >60)
GLUCOSE: 103 mg/dL — AB (ref 65–99)
Potassium: 4 mmol/L (ref 3.5–5.1)
Sodium: 138 mmol/L (ref 135–145)
Total Bilirubin: 0.4 mg/dL (ref 0.3–1.2)
Total Protein: 6.9 g/dL (ref 6.5–8.1)

## 2014-11-30 LAB — URINALYSIS, ROUTINE W REFLEX MICROSCOPIC
BILIRUBIN URINE: NEGATIVE
Glucose, UA: NEGATIVE mg/dL
HGB URINE DIPSTICK: NEGATIVE
Ketones, ur: NEGATIVE mg/dL
Leukocytes, UA: NEGATIVE
Nitrite: NEGATIVE
PROTEIN: NEGATIVE mg/dL
SPECIFIC GRAVITY, URINE: 1.02 (ref 1.005–1.030)
Urobilinogen, UA: 0.2 mg/dL (ref 0.0–1.0)
pH: 8.5 — ABNORMAL HIGH (ref 5.0–8.0)

## 2014-11-30 LAB — POC OCCULT BLOOD, ED: Fecal Occult Bld: NEGATIVE

## 2014-11-30 LAB — URINE MICROSCOPIC-ADD ON

## 2014-11-30 LAB — I-STAT TROPONIN, ED: Troponin i, poc: 0 ng/mL (ref 0.00–0.08)

## 2014-11-30 LAB — LIPASE, BLOOD: Lipase: 35 U/L (ref 22–51)

## 2014-11-30 MED ORDER — MORPHINE SULFATE 4 MG/ML IJ SOLN
4.0000 mg | Freq: Once | INTRAMUSCULAR | Status: AC
  Administered 2014-11-30: 4 mg via INTRAVENOUS
  Filled 2014-11-30: qty 1

## 2014-11-30 MED ORDER — OXYCODONE-ACETAMINOPHEN 5-325 MG PO TABS
ORAL_TABLET | ORAL | Status: AC
  Filled 2014-11-30: qty 1

## 2014-11-30 MED ORDER — ONDANSETRON HCL 4 MG/2ML IJ SOLN
4.0000 mg | Freq: Once | INTRAMUSCULAR | Status: AC
  Administered 2014-11-30: 4 mg via INTRAVENOUS
  Filled 2014-11-30: qty 2

## 2014-11-30 MED ORDER — PANTOPRAZOLE SODIUM 40 MG IV SOLR
80.0000 mg | Freq: Once | INTRAVENOUS | Status: AC
  Administered 2014-11-30: 80 mg via INTRAVENOUS
  Filled 2014-11-30: qty 80

## 2014-11-30 MED ORDER — PANTOPRAZOLE SODIUM 20 MG PO TBEC: 20.0000 mg | DELAYED_RELEASE_TABLET | Freq: Every day | ORAL | Status: DC

## 2014-11-30 MED ORDER — IOHEXOL 300 MG/ML  SOLN
100.0000 mL | Freq: Once | INTRAMUSCULAR | Status: AC | PRN
  Administered 2014-11-30: 100 mL via INTRAVENOUS

## 2014-11-30 MED ORDER — GI COCKTAIL ~~LOC~~
30.0000 mL | Freq: Once | ORAL | Status: AC
  Administered 2014-11-30: 30 mL via ORAL
  Filled 2014-11-30: qty 30

## 2014-11-30 MED ORDER — OXYCODONE-ACETAMINOPHEN 5-325 MG PO TABS
1.0000 | ORAL_TABLET | Freq: Once | ORAL | Status: AC
Start: 2014-11-30 — End: 2014-11-30
  Administered 2014-11-30: 1 via ORAL

## 2014-11-30 MED ORDER — SUCRALFATE 1 G PO TABS: 1.0000 g | ORAL_TABLET | Freq: Three times a day (TID) | ORAL | Status: DC

## 2014-11-30 MED ORDER — IOHEXOL 300 MG/ML  SOLN: 25.0000 mL | Freq: Once | INTRAMUSCULAR | Status: DC | PRN

## 2014-11-30 MED ORDER — HYDROMORPHONE HCL 1 MG/ML IJ SOLN
1.0000 mg | Freq: Once | INTRAMUSCULAR | Status: AC
  Administered 2014-11-30: 1 mg via INTRAVENOUS
  Filled 2014-11-30: qty 1

## 2014-11-30 NOTE — ED Notes (Signed)
Ambulated to restroom  

## 2014-11-30 NOTE — Discharge Instructions (Signed)
1. Medications: Carafate, Protonix, usual home medications 2. Treatment: rest, drink plenty of fluids, advance diet slowly, discontinue omeprazole, begin Protonix 3. Follow Up: Please followup with your primary doctor in 2 days for discussion of your diagnoses and further evaluation after today's visit; if you do not have a primary care doctor use the resource guide provided to find one; Please return to the ER for persistent vomiting, high fevers or worsening symptoms; please also follow with gastroenterology for further evaluation of your abdominal pain.    Abdominal Pain Many things can cause abdominal pain. Usually, abdominal pain is not caused by a disease and will improve without treatment. It can often be observed and treated at home. Your health care provider will do a physical exam and possibly order blood tests and X-rays to help determine the seriousness of your pain. However, in many cases, more time must pass before a clear cause of the pain can be found. Before that point, your health care provider may not know if you need more testing or further treatment. HOME CARE INSTRUCTIONS  Monitor your abdominal pain for any changes. The following actions may help to alleviate any discomfort you are experiencing:  Only take over-the-counter or prescription medicines as directed by your health care provider.  Do not take laxatives unless directed to do so by your health care provider.  Try a clear liquid diet (broth, tea, or water) as directed by your health care provider. Slowly move to a bland diet as tolerated. SEEK MEDICAL CARE IF:  You have unexplained abdominal pain.  You have abdominal pain associated with nausea or diarrhea.  You have pain when you urinate or have a bowel movement.  You experience abdominal pain that wakes you in the night.  You have abdominal pain that is worsened or improved by eating food.  You have abdominal pain that is worsened with eating fatty  foods.  You have a fever. SEEK IMMEDIATE MEDICAL CARE IF:   Your pain does not go away within 2 hours.  You keep throwing up (vomiting).  Your pain is felt only in portions of the abdomen, such as the right side or the left lower portion of the abdomen.  You pass bloody or black tarry stools. MAKE SURE YOU:  Understand these instructions.   Will watch your condition.   Will get help right away if you are not doing well or get worse.  Document Released: 02/21/2005 Document Revised: 05/19/2013 Document Reviewed: 01/21/2013 [facility] Patient Information 2015 [employer] [place]. This information is not intended to replace advice given to you by your health care provider. Make sure you discuss any questions you have with your health care provider.    Emergency Department Resource Guide 1) Find a Doctor and Pay Out of Pocket Although you won't have to find out who is covered by your insurance plan, it is a good idea to ask around and get recommendations. You will then need to call the office and see if the doctor you have chosen will accept you as a new patient and what types of options they offer for patients who are self-pay. Some doctors offer discounts or will set up payment plans for their patients who do not have insurance, but you will need to ask so you aren't surprised when you get to your appointment.  2) Contact Your Local Health Department Not all health departments have doctors that can see patients for sick visits, but many do, so it is worth a call to see  if yours does. If you don't know where your local health department is, you can check in your phone book. The CDC also has a tool to help you locate your state's health department, and many state websites also have listings of all of their local health departments.  3) Find a Walk-in Clinic If your illness is not likely to be very severe or complicated, you may want to try a walk in clinic. These are popping up all over the  country in pharmacies, drugstores, and shopping centers. They're usually staffed by nurse practitioners or physician assistants that have been trained to treat common illnesses and complaints. They're usually fairly quick and inexpensive. However, if you have serious medical issues or chronic medical problems, these are probably not your best option.  No Primary Care Doctor: - Call Health Connect at  [phone] - they can help you locate a primary care doctor that  accepts your insurance, provides certain services, etc. - Physician Referral Service- [phone]  Chronic Pain Problems: Organization         Address  Phone   Notes  [facility]  [phone] Patients need to be referred by their primary care doctor.   Medication Assistance: Organization         Address  Phone   Notes  [facility] Medication [facility] [address] [place] [place], [place] [address] [phone] --Must be a resident of [facility] -- Must have NO insurance coverage whatsoever (no Medicaid/ Medicare, etc.) -- The pt. MUST have a primary care doctor that directs their care regularly and follows them in the community   [facility]  [phone]   [employer]  [phone]    Agencies that provide inexpensive medical care: Organization         Address  Phone   Notes  [facility]  [phone]   [facility]    [phone]   [facility] [address] [place], [place] [address] [phone]   [facility] [address], [place] [phone]   [facility]anned Parenthood    [phone]   [facility]    [phone]   Community Health and [facility]  [address], [place] Phone:  [phone], Fax:  [phone] Hours of Operation:  9 am - 6 pm, M-F.  Also accepts Medicaid/Medicare and self-pay.  [facility]  [address], [place] Phone: [phone], Fax: [phone]. Hours of Operation:  8:30 am - 5:30 pm, M-F.  Also accepts Medicaid and self-pay.  [facility] [place] [address], [place] Phone: [phone]   [facility] [address] Natasha Bence [place], [place] [phone], Ext. [phone] Mondays & Thursdays: 7-9 AM.  First 15 patients are seen on a first come, first serve basis.    Medicaid-accepting [facility] Providers:  Organization         Address  Phone   Notes  [facility] [address], [place] [phone] Also accepts self-pay patients.  [facility] [address] Laurell Josephs [place], [place]  [phone]   [facility] [address], [place] [phone]   [facility] [address], [place] [phone]   Renaye Rakers [address], [place]   [phone] Only accepts [place] Access Medicaid patients after they have their name applied to their card.   Self-Pay (no insurance) in [facility]:  Organization         Address  Phone   Notes  Sickle Cell Patients, [facility] [address] [place], [place] [phone]   [facility] [address] [place], [place] [phone]   [facility] [place]  [address], [place] [phone]   [facility]/Dr. Osei-Bonsu  [address] [place] or [address], [place] [phone] Phone number for both [place] and [place] locations is the same.  Urgent Medical and [facility] [address] [place] [phone]   [facility] [address], [place] or [address] [phone]   [facility] [address], [place] [phone], phone; [phone], fax Sees patients 1st and 3rd Saturday of every month.  Must  not qualify for public or private insurance (i.e. Medicaid, Medicare, [place] Health Choice, Veterans' Benefits)  Household income should be no more than 200% of the poverty level The clinic cannot treat you if you are pregnant or think you are pregnant  Sexually transmitted diseases are not treated at the clinic.    Dental Care: Organization         Address  Phone  Notes  [facility] [address] [place], [place] [phone] Accepts children up to age 33 who are enrolled in [place] or [place] Health Choice; pregnant women with a Medicaid card; and children who have applied for Medicaid or [place] Health Choice, but were declined, whose parents can pay a reduced fee at time of service.  [facility] of [facility]  [address], [place] [phone] Accepts children up to age 9 who are enrolled in [place] or [place] Health Choice; pregnant women with a Medicaid card; and children who have applied for Medicaid or [place] Health Choice, but were declined, whose parents can pay a reduced fee at time of service.  [facility]  [address] [place], [place] [phone] Patients are seen by appointment only. Walk-ins are not accepted. [facility] will see patients 56 years of age and older. Monday - Tuesday (8am-5pm) Most Wednesdays (8:30-5pm) $30 per visit, cash only  [facility]  [address], [facility] [phone] Patients are seen by appointment only. Walk-ins are not accepted. [facility] will see patients 46 years of age and older. One Wednesday Evening (Monthly: Volunteer Based).  $30 per visit, cash only  [employer]  [phone] for adults; Children under age 29, call Graduate Pediatric Dentistry at [phone]. Children aged 43-14, please call [phone] to request a pediatric application.  Dental services are  provided in all areas of dental care including fillings, crowns and bridges, complete and partial dentures, implants, gum treatment, root canals, and extractions. Preventive care is also provided. Treatment is provided to both adults and children. Patients are selected via a lottery and there is often a waiting list.   [facility] [address] [place]  [phone] [email or web address]   [facility] [address] [place], [place] [phone], Ext. [phone] Second and Fourth Thursday of each month, opens at 6:30  AM; Clinic ends at 9 AM.  Patients are seen on a first-come first-served basis, and a limited number are seen during each clinic.   [facility]  [address] Ether Griffins [place], [place] [phone]   Eligibility Requirements You must have lived in [place], [place], or [place] counties for at least the last three months.   You cannot be eligible for state or federal sponsored [place], including [employer], [place], or [employer].   You generally cannot be eligible for healthcare insurance through your employer.    How to apply: Eligibility screenings are held every Tuesday and Wednesday afternoon from 1:00 pm until 4:00 pm. You do not need an appointment for the interview!  [facility] [address] [place], [place] [phone]   [facility] Health Department  [phone]   [facility] Health Department  [phone]   [facility] Health Department  [phone]    Behavioral Health Resources in the Community: Intensive Outpatient Programs Organization         Address  Phone  Notes  [facility] [address] [place], [place] [phone]   [facility] [address], [place], [place] [phone]   [facility] [address], [place], [place]  [phone]   [facility] [address]  [place], [place]  [phone] or [phone]   Substance Abuse Resources Organization         Address  Phone  Notes  [facility]  [phone]   [facility]  [phone]   [facility]  [phone]   Floydene Flock  [phone]   Residential & Outpatient Substance Abuse Program  [phone]   Psychological Services Organization         Address  Phone  Notes  [facility] Behavioral Health  [phone]   [facility]  [phone]   [facility] [address] [place] [phone] or [phone]    Mobile Crisis Teams Organization         Address  Phone  Notes  [facility], Mobile Crisis Care Unit  [phone]   [facility]  [address] [place], [place] [phone]   Doristine Locks [address] [place] [phone]    Self-Help/Support Groups Organization         Address  Phone             Notes  [facility] [place] - variety of support groups  336- I7437963 Call for more information  Narcotics Anonymous (NA), [facility] [address], [employer] [place]  2 meetings at this location   Statistician         Address  Phone  Notes  [facility] [address],    [place]  [phone]   [facility]  [address] [place] [address], [place], [place] [phone]   [facility] [address] [place], [place] [phone] Admissions: 8am-3pm M-F  [facility] [address]    [place], [place] [phone]   [facility] [address] Starling Manns [place], [place] [phone]   [facility] [place] [facility] [address]  [place], [place] [phone]   [facility] - Intensive Outpatient [address] Laurell Josephs [address], [place], [place] [phone]   [facility] ([facility] [address] [place], [place] [phone] or  [phone]  [facility] ([facility]) [address], [place], [place] [phone] Accepts Medicaid  [facility] [address]  [place] [phone] Substance Abuse/Addiction Treatment   [facility] Organization         Address  Phone  Notes  [facility]  (727) [phone]   Angie Fava, PhD [address] Ervin Knack [place], [place]   [phone] or [phone]   [facility]   [address] [place], [place] [phone]   [facility] [address] [place], [place] [phone] Insurance/Medicaid/sponsorship through [facility] and Families [address]                                    [place], [place] [phone] Therapy/tele-psych/case  [facility] [address], [place] [phone]    Dr. Lolly Mustache  [phone]   [facility] [place]  [employer] [facility] Dept. 1) [address] [place] 2) [address] [place] 3)  [address], [place] [phone]  [phone]   [facility] Child Abuse Hotline (201)100-4376 or [phone] (After Hours)

## 2014-11-30 NOTE — ED Notes (Signed)
Upper left abd pain for 1 week, sts us today by PMD and worsening pain since then. sts no help with medications taken.

## 2014-11-30 NOTE — ED Notes (Signed)
Pt sates that she has had LUQabdominal pain, chest pain and n/v. Pt also reports dark "tarry" stools and feeling bloated. Pt reports having ultrasound today but does not have the results yet.

## 2014-11-30 NOTE — ED Notes (Signed)
Also reports frequent bowel mvmets, solid but tarry.

## 2014-11-30 NOTE — ED Provider Notes (Signed)
CSN: [id number]     Arrival date & time 11/30/14  1804 History   First MD Initiated Contact with Patient 11/30/14 1928     Chief Complaint  Patient presents with  . Abdominal Pain  . Chest Pain     (Consider location/radiation/quality/duration/timing/severity/associated sxs/prior Treatment) The history is provided by the patient and medical records. No language interpreter was used.     Cynthia Mendez is a 34 y.o. female  with no major medical Hx presents to the Emergency Department complaining of gradual, persistent, progressively worsening aching LUQ abd pain onset several weeks ago with waxing and waning intense and sharp LUQ abd pain with associated nausea.  Pt has taken pain control, gas x, pepto, Xanax, enema, heat all without relief. Pt reports last BM was 4:30PM today.  Pt reports solid black stools today.  She took pepto consistently all week including all day yesterday and this afternoon.  No BRBPR.  No hematemesis.  Pt reports she had an US done at [facility]; no official results but tech reported no stones in the bile duct.  Pt reports cholecystectomy in 2007 and abdominal hysterectomy in 2013.  No other abdominal surgeries. Pain is worse with eating regardless of what she eats or drinks.  Pt is taking omeprazole daily.    PCP - V. Parchuri. MD at [facility]   History reviewed. No pertinent past medical history. Past Surgical History  Procedure Laterality Date  . Cholecystectomy    . Abdominal hysterectomy     No family history on file. History  Substance Use Topics  . Smoking status: Never Smoker   . Smokeless tobacco: Not on file  . Alcohol Use: No   OB History    No data available     Review of Systems  Constitutional: Negative for fever, diaphoresis, appetite change, fatigue and unexpected weight change.  HENT: Negative for mouth sores.   Eyes: Negative for visual disturbance.  Respiratory: Negative for cough, chest tightness, shortness of  breath and wheezing.   Cardiovascular: Negative for chest pain.  Gastrointestinal: Positive for nausea, abdominal pain and abdominal distention. Negative for vomiting, diarrhea and constipation.  Endocrine: Negative for polydipsia, polyphagia and polyuria.  Genitourinary: Negative for dysuria, urgency, frequency and hematuria.  Musculoskeletal: Negative for back pain and neck stiffness.  Skin: Negative for rash.  Allergic/Immunologic: Negative for immunocompromised state.  Neurological: Negative for syncope, light-headedness and headaches.  Hematological: Does not bruise/bleed easily.  Psychiatric/Behavioral: Negative for sleep disturbance. The patient is not nervous/anxious.       Allergies  Review of patient's allergies indicates no known allergies.  Home Medications   Prior to Admission medications   Medication Sig Start Date End Date Taking? Authorizing Provider  Azelastine-Fluticasone (DYMISTA) 137-50 MCG/ACT SUSP Place 1 spray into both nostrils daily as needed (allergies).   Yes Historical Provider, MD  cyclobenzaprine (FLEXERIL) 10 MG tablet Take 10 mg by mouth as needed for muscle spasms.   Yes Historical Provider, MD  HYDROcodone-acetaminophen (NORCO/VICODIN) 5-325 MG per tablet Take 1 tablet by mouth every 6 (six) hours as needed for moderate pain.   Yes Historical Provider, MD  loratadine (CLARITIN) 10 MG tablet Take 10 mg by mouth daily as needed for allergies.   Yes Historical Provider, MD  Olopatadine HCl (PATADAY) 0.2 % SOLN Place 1 drop into both eyes daily as needed (allergies).   Yes Historical Provider, MD  omeprazole (PRILOSEC) 40 MG capsule Take 40 mg by mouth daily.   Yes Historical  Provider, MD  pantoprazole (PROTONIX) 20 MG tablet Take 1 tablet (20 mg total) by mouth daily. 11/30/14   Aliayah Tyer, PA-C  sucralfate (CARAFATE) 1 G tablet Take 1 tablet (1 g total) by mouth 4 (four) times daily -  with meals and at bedtime. 11/30/14   Marshaun Lortie, PA-C    BP 111/68 mmHg  Pulse 62  Temp(Src) 97.7 F (36.5 C) (Oral)  Resp 18  Ht  (1.626 m)  Wt 173 lb (78.472 kg)  BMI 29.68 kg/m2  SpO2 100% Physical Exam  Constitutional: She appears well-developed and well-nourished. No distress.  Awake, alert, nontoxic appearance  HENT:  Head: Normocephalic and atraumatic.  Mouth/Throat: Oropharynx is clear and moist. No oropharyngeal exudate.  Eyes: Conjunctivae are normal. No scleral icterus.  Neck: Normal range of motion. Neck supple.  Cardiovascular: Normal rate, regular rhythm, normal heart sounds and intact distal pulses.   No murmur heard. Pulmonary/Chest: Effort normal and breath sounds normal. No respiratory distress. She has no wheezes.  Equal chest expansion  Abdominal: Soft. Bowel sounds are normal. She exhibits no distension and no mass. There is no tenderness. There is no rebound and no guarding.  Soft and nontender No abdominal distention No fluid wave No guarding or rebound No CVA tenderness  Musculoskeletal: Normal range of motion. She exhibits no edema.  Neurological: She is alert.  Speech is clear and goal oriented Moves extremities without ataxia  Skin: Skin is warm and dry. She is not diaphoretic.  Psychiatric: She has a normal mood and affect.  Nursing note and vitals reviewed.   ED Course  Procedures (including critical care time) Labs Review Labs Reviewed  CBC WITH DIFFERENTIAL/PLATELET - Abnormal; Notable for the following:    Eosinophils Relative 14 (*)    Eosinophils Absolute 1.0 (*)    All other components within normal limits  COMPREHENSIVE METABOLIC PANEL - Abnormal; Notable for the following:    Glucose, Bld 103 (*)    All other components within normal limits  URINALYSIS, ROUTINE W REFLEX MICROSCOPIC (NOT AT [facility]) - Abnormal; Notable for the following:    APPearance TURBID (*)    pH 8.5 (*)    All other components within normal limits  LIPASE, BLOOD  URINE MICROSCOPIC-ADD ON  I-STAT TROPOININ,  ED  POC OCCULT BLOOD, ED    Imaging Review Ct Abdomen Pelvis W Contrast  11/30/2014   CLINICAL DATA:  Acute onset of left upper quadrant abdominal pain, chest pain, nausea and vomiting. Upper abdominal bloating and dark tarry stools. Initial encounter.  EXAM: CT ABDOMEN AND PELVIS WITH CONTRAST  TECHNIQUE: Multidetector CT imaging of the abdomen and pelvis was performed using the standard protocol following bolus administration of intravenous contrast.  CONTRAST:  OMNIPAQUE IOHEXOL 300 MG/ML  SOLN  COMPARISON:  Abdominal ultrasound performed 11/25/2006  FINDINGS: The visualized lung bases are clear. Bilateral breast implants are partially imaged.  The liver and spleen are unremarkable in appearance. The patient is status post cholecystectomy, with clips noted along the gallbladder fossa. Prominence of the intrahepatic biliary ducts is within normal limits status post cholecystectomy. The pancreas and adrenal glands are unremarkable.  The kidneys are unremarkable in appearance. There is no evidence of hydronephrosis. No renal or ureteral stones are seen. No perinephric stranding is appreciated.  No free fluid is identified. The small bowel is unremarkable in appearance. The stomach is within normal limits. No acute vascular abnormalities are seen.  The appendix is normal in caliber and contains trace air,  without evidence of appendicitis. The colon is unremarkable in appearance.  The bladder is mildly distended and grossly unremarkable. The patient is status post hysterectomy. No suspicious adnexal masses are seen. The ovaries appear grossly symmetric. No inguinal lymphadenopathy is seen.  No acute osseous abnormalities are identified.  IMPRESSION: No acute abnormality seen within the abdomen or pelvis.   Electronically Signed   By: Roanna RaiderJeffery  Chang M.D.   On: 11/30/2014 20:54     EKG Interpretation None      MDM   Final diagnoses:  LUQ abdominal pain  Nausea and vomiting  Nausea and vomiting   Nausea and vomiting   Cynthia Mendez presents with left upper quadrant abdominal pain. Per patient she's been treated for this for quite some time with numerous medications which have provided no relief. She presents today because she is "tired of hurting."  Patient reports her a stools however she has been drinking approximately one half bottle of Pepto-Bismol per day for the last few weeks.  She reports that at times the abdominal pain radiates into her chest and she has had intermittent nausea and vomiting.  Her exam is benign.  Labs reassuring. Will obtain CT scan.  10:06 PM CT scan without acute abnormality. Patient given GI cocktail and Protonix without relief of her discomfort. Patient given Dilaudid with complete relief. She now reports she feels much better. Workup is complete and reassuring. Repeat exam her abdomen remained soft and nontender. She remains without guarding or rebound. Will be discharged home to follow-up with gastroenterology.  BP 111/68 mmHg  Pulse 62  Temp(Src) 97.7 F (36.5 C) (Oral)  Resp 18  Ht 5\' 4"  (1.626 m)  Wt 173 lb (78.472 kg)  BMI 29.68 kg/m2  SpO2 100%     Dierdre ForthHannah Vail Basista, PA-C 11/30/14 2209  Mancel BaleElliott Wentz, MD 11/30/14 2356

## 2016-05-07 DIAGNOSIS — J018 Other acute sinusitis: Secondary | ICD-10-CM | POA: Diagnosis not present

## 2016-05-07 DIAGNOSIS — M5441 Lumbago with sciatica, right side: Secondary | ICD-10-CM | POA: Diagnosis not present

## 2016-05-07 DIAGNOSIS — G8929 Other chronic pain: Secondary | ICD-10-CM | POA: Diagnosis not present

## 2016-05-07 DIAGNOSIS — G4726 Circadian rhythm sleep disorder, shift work type: Secondary | ICD-10-CM | POA: Diagnosis not present

## 2016-06-12 DIAGNOSIS — J018 Other acute sinusitis: Secondary | ICD-10-CM | POA: Diagnosis not present

## 2016-06-12 DIAGNOSIS — M5441 Lumbago with sciatica, right side: Secondary | ICD-10-CM | POA: Diagnosis not present

## 2016-06-12 DIAGNOSIS — G8929 Other chronic pain: Secondary | ICD-10-CM | POA: Diagnosis not present

## 2016-06-12 DIAGNOSIS — G4726 Circadian rhythm sleep disorder, shift work type: Secondary | ICD-10-CM | POA: Diagnosis not present

## 2016-07-16 DIAGNOSIS — R6889 Other general symptoms and signs: Secondary | ICD-10-CM | POA: Diagnosis not present

## 2016-07-16 DIAGNOSIS — R0602 Shortness of breath: Secondary | ICD-10-CM | POA: Diagnosis not present

## 2016-07-16 DIAGNOSIS — J018 Other acute sinusitis: Secondary | ICD-10-CM | POA: Diagnosis not present

## 2016-07-31 DIAGNOSIS — K219 Gastro-esophageal reflux disease without esophagitis: Secondary | ICD-10-CM | POA: Diagnosis not present

## 2016-07-31 DIAGNOSIS — J0181 Other acute recurrent sinusitis: Secondary | ICD-10-CM | POA: Diagnosis not present

## 2016-09-27 DIAGNOSIS — R1310 Dysphagia, unspecified: Secondary | ICD-10-CM | POA: Diagnosis not present

## 2016-09-27 DIAGNOSIS — G4726 Circadian rhythm sleep disorder, shift work type: Secondary | ICD-10-CM | POA: Diagnosis not present

## 2016-09-27 DIAGNOSIS — R1084 Generalized abdominal pain: Secondary | ICD-10-CM | POA: Diagnosis not present

## 2017-01-30 DIAGNOSIS — K5900 Constipation, unspecified: Secondary | ICD-10-CM | POA: Diagnosis not present

## 2017-01-30 DIAGNOSIS — R197 Diarrhea, unspecified: Secondary | ICD-10-CM | POA: Diagnosis not present

## 2017-01-30 DIAGNOSIS — R112 Nausea with vomiting, unspecified: Secondary | ICD-10-CM | POA: Diagnosis not present

## 2017-01-30 DIAGNOSIS — R109 Unspecified abdominal pain: Secondary | ICD-10-CM | POA: Diagnosis not present

## 2017-01-30 DIAGNOSIS — K219 Gastro-esophageal reflux disease without esophagitis: Secondary | ICD-10-CM | POA: Diagnosis not present

## 2017-02-15 DIAGNOSIS — R140 Abdominal distension (gaseous): Secondary | ICD-10-CM | POA: Diagnosis not present

## 2017-02-15 DIAGNOSIS — J309 Allergic rhinitis, unspecified: Secondary | ICD-10-CM | POA: Diagnosis not present

## 2017-08-07 DIAGNOSIS — Z20828 Contact with and (suspected) exposure to other viral communicable diseases: Secondary | ICD-10-CM | POA: Diagnosis not present

## 2017-08-07 DIAGNOSIS — R51 Headache: Secondary | ICD-10-CM | POA: Diagnosis not present

## 2017-08-14 DIAGNOSIS — R509 Fever, unspecified: Secondary | ICD-10-CM | POA: Diagnosis not present

## 2017-08-14 DIAGNOSIS — S39012A Strain of muscle, fascia and tendon of lower back, initial encounter: Secondary | ICD-10-CM | POA: Diagnosis not present

## 2017-08-14 DIAGNOSIS — T782XXD Anaphylactic shock, unspecified, subsequent encounter: Secondary | ICD-10-CM | POA: Diagnosis not present

## 2017-08-14 DIAGNOSIS — J328 Other chronic sinusitis: Secondary | ICD-10-CM | POA: Diagnosis not present

## 2017-09-03 DIAGNOSIS — M9905 Segmental and somatic dysfunction of pelvic region: Secondary | ICD-10-CM | POA: Diagnosis not present

## 2017-09-03 DIAGNOSIS — M9904 Segmental and somatic dysfunction of sacral region: Secondary | ICD-10-CM | POA: Diagnosis not present

## 2017-09-03 DIAGNOSIS — M9902 Segmental and somatic dysfunction of thoracic region: Secondary | ICD-10-CM | POA: Diagnosis not present

## 2017-09-03 DIAGNOSIS — M9903 Segmental and somatic dysfunction of lumbar region: Secondary | ICD-10-CM | POA: Diagnosis not present

## 2017-10-11 ENCOUNTER — Encounter: Payer: Self-pay | Admitting: Gastroenterology

## 2017-10-15 DIAGNOSIS — K219 Gastro-esophageal reflux disease without esophagitis: Secondary | ICD-10-CM | POA: Diagnosis not present

## 2017-10-15 DIAGNOSIS — K5792 Diverticulitis of intestine, part unspecified, without perforation or abscess without bleeding: Secondary | ICD-10-CM | POA: Diagnosis not present

## 2017-10-15 DIAGNOSIS — R0789 Other chest pain: Secondary | ICD-10-CM | POA: Diagnosis not present

## 2017-10-15 DIAGNOSIS — R1084 Generalized abdominal pain: Secondary | ICD-10-CM | POA: Diagnosis not present

## 2017-10-18 ENCOUNTER — Emergency Department (HOSPITAL_BASED_OUTPATIENT_CLINIC_OR_DEPARTMENT_OTHER): Payer: No Typology Code available for payment source

## 2017-10-18 ENCOUNTER — Other Ambulatory Visit: Payer: Self-pay

## 2017-10-18 ENCOUNTER — Emergency Department (HOSPITAL_BASED_OUTPATIENT_CLINIC_OR_DEPARTMENT_OTHER)
Admission: EM | Admit: 2017-10-18 | Discharge: 2017-10-18 | Disposition: A | Discharge status: Home / Self Care | Payer: No Typology Code available for payment source | Attending: Emergency Medicine | Admitting: Emergency Medicine

## 2017-10-18 ENCOUNTER — Encounter (HOSPITAL_BASED_OUTPATIENT_CLINIC_OR_DEPARTMENT_OTHER): Payer: Self-pay

## 2017-10-18 DIAGNOSIS — R112 Nausea with vomiting, unspecified: Secondary | ICD-10-CM | POA: Diagnosis not present

## 2017-10-18 DIAGNOSIS — R1110 Vomiting, unspecified: Secondary | ICD-10-CM | POA: Diagnosis not present

## 2017-10-18 DIAGNOSIS — Z79899 Other long term (current) drug therapy: Secondary | ICD-10-CM | POA: Diagnosis not present

## 2017-10-18 DIAGNOSIS — R1084 Generalized abdominal pain: Secondary | ICD-10-CM | POA: Diagnosis not present

## 2017-10-18 DIAGNOSIS — R197 Diarrhea, unspecified: Secondary | ICD-10-CM | POA: Insufficient documentation

## 2017-10-18 DIAGNOSIS — R109 Unspecified abdominal pain: Secondary | ICD-10-CM | POA: Diagnosis not present

## 2017-10-18 LAB — CBC WITH DIFFERENTIAL/PLATELET
Basophils Absolute: 0 10*3/uL (ref 0.0–0.1)
Basophils Relative: 0 %
Eosinophils Absolute: 0.9 10*3/uL — ABNORMAL HIGH (ref 0.0–0.7)
Eosinophils Relative: 8 %
HCT: 41 % (ref 36.0–46.0)
Hemoglobin: 14.4 g/dL (ref 12.0–15.0)
Lymphocytes Relative: 16 %
Lymphs Abs: 1.7 10*3/uL (ref 0.7–4.0)
MCH: 29 pg (ref 26.0–34.0)
MCHC: 35.1 g/dL (ref 30.0–36.0)
MCV: 82.5 fL (ref 78.0–100.0)
Monocytes Absolute: 1.1 10*3/uL — ABNORMAL HIGH (ref 0.1–1.0)
Monocytes Relative: 11 %
Neutro Abs: 6.8 10*3/uL (ref 1.7–7.7)
Neutrophils Relative %: 65 %
Platelets: 279 10*3/uL (ref 150–400)
RBC: 4.97 MIL/uL (ref 3.87–5.11)
RDW: 13.4 % (ref 11.5–15.5)
WBC: 10.5 10*3/uL (ref 4.0–10.5)

## 2017-10-18 LAB — URINALYSIS, ROUTINE W REFLEX MICROSCOPIC
Bilirubin Urine: NEGATIVE
Glucose, UA: NEGATIVE mg/dL
LEUKOCYTES UA: NEGATIVE
NITRITE: NEGATIVE
PH: 5.5 (ref 5.0–8.0)
Protein, ur: NEGATIVE mg/dL
Specific Gravity, Urine: >1.03 — ABNORMAL HIGH (ref 1.005–1.030)

## 2017-10-18 LAB — COMPREHENSIVE METABOLIC PANEL
ALT: 41 U/L (ref 14–54)
AST: 44 U/L — ABNORMAL HIGH (ref 15–41)
Albumin: 4 g/dL (ref 3.5–5.0)
Alkaline Phosphatase: 62 U/L (ref 38–126)
Anion gap: 9 (ref 5–15)
BUN: 15 mg/dL (ref 6–20)
CO2: 22 mmol/L (ref 22–32)
Calcium: 8.5 mg/dL — ABNORMAL LOW (ref 8.9–10.3)
Chloride: 105 mmol/L (ref 101–111)
Creatinine, Ser: 0.85 mg/dL (ref 0.44–1.00)
GFR calc Af Amer: >60 mL/min (ref >60)
GFR calc non Af Amer: >60 mL/min (ref >60)
Glucose, Bld: 102 mg/dL — ABNORMAL HIGH (ref 65–99)
Potassium: 3.5 mmol/L (ref 3.5–5.1)
Sodium: 136 mmol/L (ref 135–145)
Total Bilirubin: 0.6 mg/dL (ref 0.3–1.2)
Total Protein: 7.1 g/dL (ref 6.5–8.1)

## 2017-10-18 LAB — URINALYSIS, MICROSCOPIC (REFLEX)

## 2017-10-18 LAB — LIPASE, BLOOD: Lipase: 30 U/L (ref 11–51)

## 2017-10-18 MED ORDER — HYDROCODONE-ACETAMINOPHEN 5-325 MG PO TABS: 1.0000 | ORAL_TABLET | Freq: Four times a day (QID) | ORAL | 0 refills | Status: DC | PRN

## 2017-10-18 MED ORDER — FAMOTIDINE 20 MG PO TABS: 20.0000 mg | ORAL_TABLET | Freq: Two times a day (BID) | ORAL | 0 refills | Status: DC

## 2017-10-18 MED ORDER — DICYCLOMINE HCL 20 MG PO TABS: 20.0000 mg | ORAL_TABLET | Freq: Two times a day (BID) | ORAL | 0 refills | Status: DC

## 2017-10-18 MED ORDER — DICYCLOMINE HCL 10 MG/ML IM SOLN
20.0000 mg | Freq: Once | INTRAMUSCULAR | Status: AC
  Administered 2017-10-18: 20 mg via INTRAMUSCULAR
  Filled 2017-10-18: qty 2

## 2017-10-18 MED ORDER — SODIUM CHLORIDE 0.9 % IV BOLUS
1000.0000 mL | Freq: Once | INTRAVENOUS | Status: AC
Start: 2017-10-18 — End: 2017-10-18
  Administered 2017-10-18: 1000 mL via INTRAVENOUS

## 2017-10-18 MED ORDER — HYDROMORPHONE HCL 1 MG/ML IJ SOLN
1.0000 mg | Freq: Once | INTRAMUSCULAR | Status: AC
  Administered 2017-10-18: 1 mg via INTRAVENOUS

## 2017-10-18 MED ORDER — FAMOTIDINE IN NACL 20-0.9 MG/50ML-% IV SOLN
20.0000 mg | Freq: Once | INTRAVENOUS | Status: AC
  Administered 2017-10-18: 20 mg via INTRAVENOUS

## 2017-10-18 MED ORDER — HYDROMORPHONE HCL 1 MG/ML IJ SOLN
1.0000 mg | Freq: Once | INTRAMUSCULAR | Status: DC
  Filled 2017-10-18: qty 1

## 2017-10-18 MED ORDER — DIPHENHYDRAMINE HCL 50 MG/ML IJ SOLN
50.0000 mg | Freq: Once | INTRAMUSCULAR | Status: AC
  Administered 2017-10-18: 50 mg via INTRAVENOUS

## 2017-10-18 MED ORDER — ONDANSETRON HCL 4 MG/2ML IJ SOLN
4.0000 mg | Freq: Once | INTRAMUSCULAR | Status: AC
  Administered 2017-10-18: 4 mg via INTRAVENOUS
  Filled 2017-10-18: qty 2

## 2017-10-18 MED ORDER — FAMOTIDINE IN NACL 20-0.9 MG/50ML-% IV SOLN
INTRAVENOUS | Status: AC
  Filled 2017-10-18: qty 50

## 2017-10-18 MED ORDER — ONDANSETRON HCL 4 MG PO TABS: 4.0000 mg | ORAL_TABLET | Freq: Four times a day (QID) | ORAL | 0 refills | Status: DC

## 2017-10-18 MED ORDER — SODIUM CHLORIDE 0.9 % IV BOLUS
1000.0000 mL | Freq: Once | INTRAVENOUS | Status: AC
  Administered 2017-10-18: 1000 mL via INTRAVENOUS

## 2017-10-18 MED ORDER — IOPAMIDOL (ISOVUE-300) INJECTION 61%
100.0000 mL | Freq: Once | INTRAVENOUS | Status: AC | PRN
  Administered 2017-10-18: 100 mL via INTRAVENOUS

## 2017-10-18 MED ORDER — DIPHENHYDRAMINE HCL 50 MG/ML IJ SOLN
INTRAMUSCULAR | Status: AC
  Filled 2017-10-18: qty 1

## 2017-10-18 MED ORDER — MORPHINE SULFATE (PF) 4 MG/ML IV SOLN
4.0000 mg | Freq: Once | INTRAVENOUS | Status: AC
  Administered 2017-10-18: 4 mg via INTRAVENOUS
  Filled 2017-10-18: qty 1

## 2017-10-18 NOTE — ED Triage Notes (Addendum)
C/o abd pain, n/v/d x 1 week-states she was seen by PCP 3 days ago-testing done but no dx-rx abx for possible diverticulitis-GI appt in June-NAD-steady gait

## 2017-10-18 NOTE — Discharge Instructions (Signed)
Medications: Bentyl, Pepcid, Zofran, Norco  Treatment: Take Bentyl as prescribed twice daily.  Take Pepcid 1-2 times daily.  Take Zofran every 6 hours as needed for nausea or vomiting.  For severe pain, take 1-2 Norco every 6 hours.  Do not drink alcohol, drive, operate machinery or participate in any other potentially dangerous activities while taking opiate pain medication as it may make you sleepy. Do not take this medication with any other sedating medications, either prescription or over-the-counter. If you were prescribed Percocet or Vicodin, do not take these with acetaminophen (Tylenol) as it is already contained within these medications and overdose of Tylenol is dangerous.   This medication is an opiate (or narcotic) pain medication and can be habit forming.  Use it as little as possible to achieve adequate pain control.  Do not use or use it with extreme caution if you have a history of opiate abuse or dependence. This medication is intended for your use only - do not give any to anyone else and keep it in a secure place where nobody else, especially children, have access to it. It will also cause or worsen constipation, so you may want to consider taking an over-the-counter stool softener while you are taking this medication.  Follow-up: Please follow-up with your primary care provider in 4 to 5 days for recheck.  Please follow-up with gastroenterology as scheduled.  You may want to get on the cancellation list to be seen sooner.  Please return to the emergency department if you develop any new or worsening symptoms including fever, intractable vomiting, localized abdominal pain, significant amounts of blood in your stool, or any other new or concerning symptom.

## 2017-10-18 NOTE — ED Provider Notes (Signed)
[facility] EMERGENCY DEPARTMENT Provider Note   CSN: [id number] Arrival date & time: 10/18/17  1709     History   Chief Complaint Chief Complaint  Patient presents with  . Abdominal Pain    HPI Cynthia Mendez is a 37 y.o. female who presents with a 2-week history of abdominal pain, nausea, vomiting, diarrhea.  Patient has not been able to keep any food down.  Even water makes her stomach hurt.  Her pain is mostly upper.  It is constant, but worse when she eats or tries to drink anything.  She describes her pain as sharp and crampy.  It is severe.  She also has abdominal swelling.  She denies any bloody stools.  She saw her primary care provider who conducted labs and an x-ray which were unremarkable.  She was started on Cipro and Flagyl for possible diverticulitis.  Patient has been afebrile.  She has history of cholecystectomy and abdominal hysterectomy.  She denies any urinary symptoms, abnormal vaginal bleeding or discharge.  HPI  History reviewed. No pertinent past medical history.  There are no active problems to display for this patient.   Past Surgical History:  Procedure Laterality Date  . ABDOMINAL HYSTERECTOMY    . CHOLECYSTECTOMY       OB History   None      Home Medications    Prior to Admission medications   Medication Sig Start Date End Date Taking? Authorizing Provider  Azelastine-Fluticasone (DYMISTA) 137-50 MCG/ACT SUSP Place 1 spray into both nostrils daily as needed (allergies).    [provider]  cyclobenzaprine (FLEXERIL) 10 MG tablet Take 10 mg by mouth as needed for muscle spasms.    [provider]  dicyclomine (BENTYL) 20 MG tablet Take 1 tablet (20 mg total) by mouth 2 (two) times daily. 10/18/17   Abigial Newville, Waylan Boga, PA-C  famotidine (PEPCID) 20 MG tablet Take 1 tablet (20 mg total) by mouth 2 (two) times daily. 10/18/17   Rasheda Ledger, Waylan Boga, PA-C  HYDROcodone-acetaminophen (NORCO/VICODIN) 5-325 MG tablet Take 1-2  tablets by mouth every 6 (six) hours as needed. 10/18/17   Mikelle Myrick, Waylan Boga, PA-C  loratadine (CLARITIN) 10 MG tablet Take 10 mg by mouth daily as needed for allergies.    [provider]  Olopatadine HCl (PATADAY) 0.2 % SOLN Place 1 drop into both eyes daily as needed (allergies).    [provider]  omeprazole (PRILOSEC) 40 MG capsule Take 40 mg by mouth daily.    [provider]  ondansetron (ZOFRAN) 4 MG tablet Take 1 tablet (4 mg total) by mouth every 6 (six) hours. 10/18/17   Basir Niven, Waylan Boga, PA-C  pantoprazole (PROTONIX) 20 MG tablet Take 1 tablet (20 mg total) by mouth daily. 11/30/14   Muthersbaugh, Dahlia Client, PA-C  sucralfate (CARAFATE) 1 G tablet Take 1 tablet (1 g total) by mouth 4 (four) times daily -  with meals and at bedtime. 11/30/14   Muthersbaugh, Dahlia Client, PA-C    Family History No family history on file.  Social History Social History   Tobacco Use  . Smoking status: Never Smoker  . Smokeless tobacco: Never Used  Substance Use Topics  . Alcohol use: No  . Drug use: No     Allergies   Isovue [iopamidol]; Morphine and related; Other; and Soy allergy   Review of Systems Review of Systems  Constitutional: Negative for chills and fever.  HENT: Negative for facial swelling and sore throat.   Respiratory: Negative for  shortness of breath.   Cardiovascular: Negative for chest pain.  Gastrointestinal: Positive for abdominal pain, diarrhea, nausea and vomiting. Negative for blood in stool.  Genitourinary: Negative for dysuria.  Musculoskeletal: Negative for back pain.  Skin: Negative for rash and wound.  Neurological: Negative for headaches.  Psychiatric/Behavioral: The patient is not nervous/anxious.      Physical Exam Updated Vital Signs BP 102/71   Pulse 77   Temp 97.7 F (36.5 C) (Oral)   Resp 19   Ht  (1.626 m)   Wt 90.7 kg (200 lb)   SpO2 100%   BMI 34.33 kg/m   Physical Exam  Constitutional: She appears well-developed  and well-nourished. No distress.  HENT:  Head: Normocephalic and atraumatic.  Mouth/Throat: Oropharynx is clear and moist. No oropharyngeal exudate.  Eyes: Pupils are equal, round, and reactive to light. Conjunctivae are normal. Right eye exhibits no discharge. Left eye exhibits no discharge. No scleral icterus.  Neck: Normal range of motion. Neck supple. No thyromegaly present.  Cardiovascular: Normal rate, regular rhythm, normal heart sounds and intact distal pulses. Exam reveals no gallop and no friction rub.  No murmur heard. Pulmonary/Chest: Effort normal and breath sounds normal. No stridor. No respiratory distress. She has no wheezes. She has no rales.  Abdominal: Soft. Bowel sounds are normal. She exhibits no distension. There is generalized tenderness and tenderness in the right upper quadrant and epigastric area. There is no rebound, no guarding and no CVA tenderness.  Musculoskeletal: She exhibits no edema.  Lymphadenopathy:    She has no cervical adenopathy.  Neurological: She is alert. Coordination normal.  Skin: Skin is warm and dry. No rash noted. She is not diaphoretic. No pallor.  Psychiatric: She has a normal mood and affect.  Nursing note and vitals reviewed.    ED Treatments / Results  Labs (all labs ordered are listed, but only abnormal results are displayed) Labs Reviewed  URINALYSIS, ROUTINE W REFLEX MICROSCOPIC - Abnormal; Notable for the following components:      Result Value   Specific Gravity, Urine >1.030 (*)    Hgb urine dipstick SMALL (*)    Ketones, ur >80 (*)    All other components within normal limits  URINALYSIS, MICROSCOPIC (REFLEX) - Abnormal; Notable for the following components:   Bacteria, UA MANY (*)    All other components within normal limits  COMPREHENSIVE METABOLIC PANEL - Abnormal; Notable for the following components:   Glucose, Bld 102 (*)    Calcium 8.5 (*)    AST 44 (*)    All other components within normal limits  CBC WITH  DIFFERENTIAL/PLATELET - Abnormal; Notable for the following components:   Monocytes Absolute 1.1 (*)    Eosinophils Absolute 0.9 (*)    All other components within normal limits  LIPASE, BLOOD    EKG None  Radiology Ct Abdomen Pelvis W Contrast  Result Date: 10/18/2017 CLINICAL DATA:  Abdominal pain with nausea and vomiting EXAM: CT ABDOMEN AND PELVIS WITH CONTRAST TECHNIQUE: Multidetector CT imaging of the abdomen and pelvis was performed using the standard protocol following bolus administration of intravenous contrast. The patient reportedly developed pruritus and mild urticaria after the contrast injection. The patient was reportedly treated by the emergency department physician on site. This episode constitutes an allergic reaction to iodinated contrast material. CONTRAST:  ISOVUE-300 IOPAMIDOL (ISOVUE-300) INJECTION 61% COMPARISON:  None. FINDINGS: Lower chest: Lung bases are clear. Hepatobiliary: No focal liver lesions are evident. Gallbladder is absent. There is mild  biliary duct dilatation. There is mild prominence of the common hepatic duct with tapering distally. No biliary duct mass or calculus evident. Pancreas: There is no pancreatic mass or inflammatory focus. Spleen: No splenic lesions are evident. Adrenals/Urinary Tract: Adrenals bilaterally appear normal. Kidneys bilaterally show no evident mass or hydronephrosis on either side. There is no renal or ureteral calculus on either side. Urinary bladder is midline with Diehl thickness within normal limits. Stomach/Bowel: There is no appreciable bowel Steury or mesenteric thickening. There is no demonstrable bowel obstruction. No free air or portal venous air. Vascular/Lymphatic: There is no abdominal aortic aneurysm. No vascular lesions evident. No adenopathy is appreciable in the abdomen or pelvis. Reproductive: Uterus is absent.  There is no evident pelvic mass. Other: Appendix appears normal. There is no ascites or abscess in the  abdomen or pelvis. There is a rather minimal ventral hernia containing only fat. Musculoskeletal: There are no blastic or lytic bone lesions. There is no intramuscular or abdominal Llewellyn lesion. IMPRESSION: 1. Patient experienced allergic reaction to iodinated contrast material administration. Patient received treatment for this allergic reaction which included parotiditis and mild urticaria in the emergency department on site. 2. No evident bowel obstruction. No abscess. Appendix appears normal. 3.  No renal or ureteral calculus.  No hydronephrosis. 4. 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 No focal liver lesions. No biliary duct mass or calculus evident. 5.  Uterus absent. 6.  Rather minimal ventral hernia containing only fat. Electronically Signed   By: Bretta Bang III M.D.   On: 10/18/2017 20:11    Procedures Procedures (including critical care time)  Medications Ordered in ED Medications  diphenhydrAMINE (BENADRYL) 50 MG/ML injection (has no administration in time range)  famotidine (PEPCID) 20-0.9 MG/50ML-% IVPB (has no administration in time range)  sodium chloride 0.9 % bolus 1,000 mL (0 mLs Intravenous Stopped 10/18/17 2035)  morphine 4 MG/ML injection 4 mg (4 mg Intravenous Given 10/18/17 1928)  ondansetron (ZOFRAN) injection 4 mg (4 mg Intravenous Given 10/18/17 1928)  iopamidol (ISOVUE-300) 61 % injection 100 mL (100 mLs Intravenous Contrast Given 10/18/17 1942)  famotidine (PEPCID) IVPB 20 mg premix (0 mg Intravenous Stopped 10/18/17 2035)  diphenhydrAMINE (BENADRYL) injection 50 mg (50 mg Intravenous Given 10/18/17 1955)  dicyclomine (BENTYL) injection 20 mg (20 mg Intramuscular Given 10/18/17 2115)  sodium chloride 0.9 % bolus 1,000 mL (0 mLs Intravenous Stopped 10/18/17 2242)  ondansetron (ZOFRAN) injection 4 mg (4 mg Intravenous Given 10/18/17 2118)  HYDROmorphone (DILAUDID) injection 1 mg (1 mg Intravenous Given 10/18/17 2121)     Initial  Impression / Assessment and Plan / ED Course  I have reviewed the triage vital signs and the nursing notes.  Pertinent labs & imaging results that were available during my care of the patient were reviewed by me and considered in my medical decision making (see chart for details).     Patient with abdominal pain, nausea, vomiting, and diarrhea.  Labs are unremarkable.  UA does show greater than 80 ketones, many bacteria.  Low suspicion for UTI.  CT abdomen pelvis shows no acute findings.  Patient has minimal ventral hernia containing only fat, which I made her aware of.  Patient had allergic reaction to IV contrast which was resolved with Pepcid and Benadryl in the ED.  Patient's pain controlled with Dilaudid and Bentyl, and Zofran to control nausea.  Patient tolerating oral fluids prior to discharge.  Will discharge home with Bentyl, Pepcid, Zofran, and short course of Norco.  I  reviewed the [place] narcotic database and found no discrepancies.  Patient has scheduled appointment for GI in 2 weeks.  Advised to follow-up with PCP next week for recheck.  Return precautions discussed.  Patient understands and agrees with plan.  Patient vitals stable throughout ED course and discharged in satisfactory condition.  Final Clinical Impressions(s) / ED Diagnoses   Final diagnoses:  Generalized abdominal pain  Nausea vomiting and diarrhea    ED Discharge Orders        Ordered    dicyclomine (BENTYL) 20 MG tablet  2 times daily     10/18/17 2229    famotidine (PEPCID) 20 MG tablet  2 times daily     10/18/17 2229    ondansetron (ZOFRAN) 4 MG tablet  Every 6 hours     10/18/17 2229    HYDROcodone-acetaminophen (NORCO/VICODIN) 5-325 MG tablet  Every 6 hours PRN     10/18/17 2229       Emi Holes, PA-C 10/19/17 0001    Gerhard Munch, MD 10/19/17 [phone]

## 2017-10-18 NOTE — ED Notes (Signed)
Patient given some water for PO challenge.

## 2017-10-25 ENCOUNTER — Other Ambulatory Visit (INDEPENDENT_AMBULATORY_CARE_PROVIDER_SITE_OTHER): Payer: 59

## 2017-10-25 ENCOUNTER — Ambulatory Visit (INDEPENDENT_AMBULATORY_CARE_PROVIDER_SITE_OTHER): Payer: 59 | Admitting: Physician Assistant

## 2017-10-25 ENCOUNTER — Encounter

## 2017-10-25 ENCOUNTER — Encounter: Payer: Self-pay | Admitting: Physician Assistant

## 2017-10-25 VITALS — BP 108/64 | HR 68 | Ht 64.0 in | Wt 202.0 lb

## 2017-10-25 DIAGNOSIS — R140 Abdominal distension (gaseous): Secondary | ICD-10-CM | POA: Diagnosis not present

## 2017-10-25 DIAGNOSIS — R109 Unspecified abdominal pain: Secondary | ICD-10-CM

## 2017-10-25 DIAGNOSIS — R194 Change in bowel habit: Secondary | ICD-10-CM | POA: Diagnosis not present

## 2017-10-25 DIAGNOSIS — R112 Nausea with vomiting, unspecified: Secondary | ICD-10-CM | POA: Diagnosis not present

## 2017-10-25 LAB — IGA: IGA: 146 mg/dL (ref 68–378)

## 2017-10-25 MED ORDER — ONDANSETRON HCL 4 MG PO TABS: ORAL_TABLET | ORAL | 2 refills | Status: DC

## 2017-10-25 MED ORDER — DICYCLOMINE HCL 20 MG PO TABS: ORAL_TABLET | ORAL | 2 refills | Status: DC

## 2017-10-25 MED ORDER — PEG 3350-KCL-NA BICARB-NACL 420 G PO SOLR: ORAL | 0 refills | Status: DC

## 2017-10-25 NOTE — Patient Instructions (Addendum)
Your provider has requested that you go to the basement level for lab work before leaving today. Press "B" on the elevator. The lab is located at the first door on the left as you exit the elevator.  We sent prescription refills and the colonoscopy prep prescription to [facility]. 1. Nulytely prep 2. Zofran  ( Ondansetron) 4 mg 3. Dicyclomine 20 mg.  You have been scheduled for an endoscopy and colonoscopy. Please follow the written instructions given to you at your visit today. Please pick up your prep supplies at the pharmacy within the next 1-3 days. If you use inhalers (even only as needed), please bring them with you on the day of your procedure. Your physician has requested that you go to www.startemmi.com and enter the access code given to you at your visit today. This web site gives a general overview about your procedure. However, you should still follow specific instructions given to you by our office regarding your preparation for the procedure.  If you are age 24 or younger, your body mass index should be between 19-25. Your Body mass index is 34.67 kg/m. If this is out of the aformentioned range listed, please consider follow up with your Primary Care Provider.

## 2017-10-25 NOTE — Progress Notes (Signed)
Chief Complaint: Abdominal pain, bloating, nausea and vomiting, change in bowel habits  HPI: Cynthia Mendez is a 37 year old female with a past medical history of abdominal hysterectomy and cholecystectomy, who was referred to me by Charlette Caffey, MD for a complaint of abdominal pain, bloating, nausea and vomiting and change in bowel habits.   10/18/2017 ED visit for abdominal pain with nausea vomiting and diarrhea.  Labs at that time show urinalysis which was normal, CMP within normal limits and CBC within normal limits.  Lipase normal.  CT abdomen pelvis with contrast was done but patient had an allergic reaction to the iodinated contrast material and received treatment for this allergic reaction.  No evident bowel obstruction.  No abscess.  Appendix appeared normal.  No renal or ureteral calculus.  No hydronephrosis.  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  No focal liver lesions.  No biliary duct mass or calculus evident.  Uterus absent.  Rather minimal ventral hernia containing only fat.  Patient discharged home with Bentyl, Pepcid, Zofran and a short close of Norco. 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  This did help and less ""soy accidentally got my food", if it did she had a swelling of her abdomen and felt as though she could not breathe with the chest tightness/pain.  Gradually though just avoiding the soy did not matter and over the past 3 to 4 months the patient has had various word reactions to all types of food.  She has 1 of 2 reactions one being chest tightness and pain and feeling like she cannot breathe and the other being of severe abdominal swelling "passed my boobs", severe abdominal pain with nausea and  vomiting and sometimes diarrhea.  Patient tells me will take days for this to go back down.  She uses Benadryl and muscle relaxers sometimes as well as sitting in the hot tub occasionally to help with this, and only minimizes the severe effects.  She has called 911 out to the house on multiple occasions.  They have not been able to do anything for her.  Most recently about 2 weeks ago patient had a severe reaction after eating dried pineapple where she started to vomit.  She took Benadryl, prednisone and her albuterol inhaler as she felt she could not breathe.  Her symptoms continued for the next 3 days and she continued to feel bad.  She was living off of just water and woke the morning prior to their leaving for home and had 3 episodes of vomiting and felt as though she could not breathe.  She also had further swelling in her abdomen and this time she had not eaten anything.  Did go to see her PCP when she came back had an x-ray which did not show a blockage per the patient.  Also was on antibiotics for suspected "diverticulitis", though they were not sure this was the case at all.  She took Cipro Flagyl for a week.  A week ago the patient then ended up in the ER as above as she had continued symptoms.  During this last long episode she had diarrhea, just yesterday the patient felt as though she could eat something more than liquids and did have a semisolid bowel movement  for the first time.  Patient is very frustrated as this is not ruining her life and also is scared that this reaction will occur when she eats anything. Denies fever, chills, weight loss, blood in her stool or melena.  Also denies a family history of IBD or celiac disease.  Past Medical History:  Diagnosis Date  . GERD (gastroesophageal reflux disease)     Past Surgical History:  Procedure Laterality Date  . ABDOMINAL HYSTERECTOMY    . CHOLECYSTECTOMY      Current Outpatient Medications  Medication Sig Dispense Refill  .  Azelastine-Fluticasone (DYMISTA) 137-50 MCG/ACT SUSP Place 1 spray into both nostrils daily as needed (allergies).    . cyclobenzaprine (FLEXERIL) 10 MG tablet Take 10 mg by mouth as needed for muscle spasms.    Marland Kitchen dicyclomine (BENTYL) 20 MG tablet Take 1 tab twice daily. 60 tablet 2  . famotidine (PEPCID) 20 MG tablet Take 1 tablet (20 mg total) by mouth 2 (two) times daily. 30 tablet 0  . HYDROcodone-acetaminophen (NORCO/VICODIN) 5-325 MG tablet Take 1-2 tablets by mouth every 6 (six) hours as needed. 10 tablet 0  . loratadine (CLARITIN) 10 MG tablet Take 10 mg by mouth daily as needed for allergies.    . Olopatadine HCl (PATADAY) 0.2 % SOLN Place 1 drop into both eyes daily as needed (allergies).    Marland Kitchen omeprazole (PRILOSEC) 40 MG capsule Take 40 mg by mouth daily.    . ondansetron (ZOFRAN) 4 MG tablet Take 1 tablet (4 mg total) by mouth every 6 (six) hours. 12 tablet 0  . polyethylene glycol-electrolytes (NULYTELY/GOLYTELY) 420 g solution Take as directed for colonoscopy prep. 4000 mL 0   No current facility-administered medications for this visit.     Allergies as of 10/25/2017 - Review Complete 10/25/2017  Allergen Reaction Noted  . Isovue [iopamidol] Hives and Itching 10/18/2017  . Morphine and related  10/18/2017  . Other  10/18/2017  . Soy allergy  10/18/2017    Family History  Problem Relation Age of Onset  . Irritable bowel syndrome Maternal Grandmother     Social History   Socioeconomic History  . Marital status: Married    Spouse name: Not on file  . Number of children: Not on file  . Years of education: Not on file  . Highest education level: Not on file  Occupational History  . Not on file  Social Needs  . Financial resource strain: Not on file  . Food insecurity:    Worry: Not on file    Inability: Not on file  . Transportation needs:    Medical: Not on file    Non-medical: Not on file  Tobacco Use  . Smoking status: Never Smoker  . Smokeless tobacco: Never  Used  Substance and Sexual Activity  . Alcohol use: No  . Drug use: No  . Sexual activity: Not on file  Lifestyle  . Physical activity:    Days per week: Not on file    Minutes per session: Not on file  . Stress: Not on file  Relationships  . Social connections:    Talks on phone: Not on file    Gets together: Not on file    Attends religious service: Not on file    Active member of club or organization: Not on file    Attends meetings of clubs or organizations: Not on file    Relationship status: Not on file  . Intimate partner violence:    Fear of  current or ex partner: Not on file    Emotionally abused: Not on file    Physically abused: Not on file    Forced sexual activity: Not on file  Other Topics Concern  . Not on file  Social History Narrative  . Not on file    Review of Systems:    Constitutional: No weight loss, fever, chills, weakness or fatigue HEENT: Eyes: No change in vision               Ears, Nose, Throat:  No change in hearing or congestion Skin: No rash or itching Cardiovascular: No chest pain, chest pressure or palpitations   Respiratory: No SOB or cough Gastrointestinal: See HPI and otherwise negative Genitourinary: No dysuria or change in urinary frequency Neurological: No headache, dizziness or syncope Musculoskeletal: No new muscle or joint pain Hematologic: No bleeding or bruising Psychiatric: No history of depression or anxiety    Physical Exam:  Vital signs: BP 108/64   Pulse 68   Ht  (1.626 m)   Wt 202 lb (91.6 kg)   BMI 34.67 kg/m   Constitutional:   Pleasant Caucasian female appears to be in NAD, Well developed, Well nourished, alert and cooperative Head:  Normocephalic and atraumatic. Eyes:   PEERL, EOMI. No icterus. Conjunctiva pink. Ears:  Normal auditory acuity. Neck:  Supple Throat: Oral cavity and pharynx without inflammation, swelling or lesion.  Respiratory: Respirations even and unlabored. Lungs clear to  auscultation bilaterally.   No wheezes, crackles, or rhonchi.  Cardiovascular: Normal S1, S2. No MRG. Regular rate and rhythm. No peripheral edema, cyanosis or pallor.  Gastrointestinal:  Soft, nondistended, nontender. No rebound or guarding. Normal bowel sounds. No appreciable masses or hepatomegaly. Rectal:  Not performed.  Msk:  Symmetrical without gross deformities. Without edema, no deformity or joint abnormality.  Neurologic:  Alert and  oriented x4;  grossly normal neurologically.  Skin:   Dry and intact without significant lesions or rashes. Psychiatric: Oriented to person, place and time. Demonstrates good judgement and reason without abnormal affect or behaviors.  RELEVANT LABS AND IMAGING: CBC    Component Value Date/Time   WBC 10.5 10/18/2017 1926   RBC 4.97 10/18/2017 1926   HGB 14.4 10/18/2017 1926   HCT 41.0 10/18/2017 1926   PLT 279 10/18/2017 1926   MCV 82.5 10/18/2017 1926   MCH 29.0 10/18/2017 1926   MCHC 35.1 10/18/2017 1926   RDW 13.4 10/18/2017 1926   LYMPHSABS 1.7 10/18/2017 1926   MONOABS 1.1 (H) 10/18/2017 1926   EOSABS 0.9 (H) 10/18/2017 1926   BASOSABS 0.0 10/18/2017 1926    CMP     Component Value Date/Time   NA 136 10/18/2017 1926   K 3.5 10/18/2017 1926   CL 105 10/18/2017 1926   CO2 22 10/18/2017 1926   GLUCOSE 102 (H) 10/18/2017 1926   BUN 15 10/18/2017 1926   CREATININE 0.85 10/18/2017 1926   CALCIUM 8.5 (L) 10/18/2017 1926   PROT 7.1 10/18/2017 1926   ALBUMIN 4.0 10/18/2017 1926   AST 44 (H) 10/18/2017 1926   ALT 41 10/18/2017 1926   ALKPHOS 62 10/18/2017 1926   BILITOT 0.6 10/18/2017 1926   GFRNONAA >60 10/18/2017 1926   GFRAA >60 10/18/2017 1926    Assessment: 1.  Abdominal pain: Typically in the epigastrium after eating, about 20 minutes or so afterwards, also with other symptoms of severe abdominal bloating, nausea and vomiting and some change in bowel habits, thought related to soy products in  the past, though now is occurring  irregardless of avoiding this; consider allergy versus partial bowel obstruction (hysterectomy and cholecystectomy in the past) versus other 2.  Change in bowel habits: See above, sometimes to diarrhea with these "attacks" 3.  Nausea and vomiting: With these attacks, typically resolves afterwards 4.  Bloating: With above  Plan: 1.  Patient scheduled for EGD and colonoscopy in the [facility] with Dr. Christella Hartigan.  Patient is allergic to soy products.  She will need something other than Propofol.  Discussed risks, benefits, limitations and alternatives and the patient agrees to proceed. 2.  Ordered labs for celiac testing. 3.  Patient to continue with what she is doing for now.  Refilled her Dicyclomine 20 mg twice daily #60 with 1 refill and Zofran 8 mg every 4-6 hours as needed for nausea #60 with 1 refill 4.  Did discuss with patient that she could be having partial obstructive symptoms as she has had a cholecystectomy as well as total hysterectomy in the past and these symptoms do tend to come on after eating and involve nausea vomiting and severe abdominal bloating with a change in bowel habits.   5.  Patient to follow in clinic per recommendations from Dr. Christella Hartigan after her procedures  Hyacinth Meeker, PA-C [facility] 10/25/2017, 2:43 PM  Cc: Charlette Caffey, MD

## 2017-10-27 NOTE — Progress Notes (Signed)
I agree with the above note, plan 

## 2017-10-28 LAB — TISSUE TRANSGLUTAMINASE, IGG: (TTG) AB, IGG: 3 U/mL

## 2017-10-29 ENCOUNTER — Other Ambulatory Visit: Payer: Self-pay

## 2017-10-29 ENCOUNTER — Ambulatory Visit (AMBULATORY_SURGERY_CENTER): Payer: 59 | Admitting: Gastroenterology

## 2017-10-29 ENCOUNTER — Encounter: Payer: Self-pay | Admitting: Gastroenterology

## 2017-10-29 ENCOUNTER — Ambulatory Visit: Payer: BLUE CROSS/BLUE SHIELD | Admitting: Gastroenterology

## 2017-10-29 VITALS — BP 110/74 | HR 90 | Temp 98.6°F | Resp 13 | Ht 64.0 in | Wt 202.0 lb

## 2017-10-29 DIAGNOSIS — K2970 Gastritis, unspecified, without bleeding: Secondary | ICD-10-CM

## 2017-10-29 DIAGNOSIS — R194 Change in bowel habit: Secondary | ICD-10-CM | POA: Diagnosis present

## 2017-10-29 DIAGNOSIS — K2990 Gastroduodenitis, unspecified, without bleeding: Secondary | ICD-10-CM | POA: Diagnosis not present

## 2017-10-29 DIAGNOSIS — K2950 Unspecified chronic gastritis without bleeding: Secondary | ICD-10-CM | POA: Diagnosis not present

## 2017-10-29 DIAGNOSIS — R140 Abdominal distension (gaseous): Secondary | ICD-10-CM | POA: Diagnosis not present

## 2017-10-29 DIAGNOSIS — R112 Nausea with vomiting, unspecified: Secondary | ICD-10-CM | POA: Diagnosis not present

## 2017-10-29 DIAGNOSIS — R110 Nausea: Secondary | ICD-10-CM | POA: Diagnosis not present

## 2017-10-29 MED ORDER — SODIUM CHLORIDE 0.9 % IV SOLN: 500.0000 mL | Freq: Once | INTRAVENOUS | Status: DC

## 2017-10-29 NOTE — Progress Notes (Signed)
Pt. Reports no change in her medical or surgical history since her pre-visit 10/25/2017.

## 2017-10-29 NOTE — Op Note (Signed)
[facility] Patient Name: Cynthia Mendez Procedure Date: 10/29/2017 11:01 AM MRN: [id number] Endoscopist: Rachael Fee , MD Age: 37 Referring MD:  Date of Birth: 03-12-1981 Gender: Female Account #: [id number] Procedure:                Colonoscopy Indications:              Generalized abdominal pain, intermittent diarrhea,                            vomiting, distention Medicines:                Monitored Anesthesia Care Procedure:                Pre-Anesthesia Assessment:                           - Prior to the procedure, a History and Physical                            was performed, and patient medications and                            allergies were reviewed. The patient's tolerance of                            previous anesthesia was also reviewed. The risks                            and benefits of the procedure and the sedation                            options and risks were discussed with the patient.                            All questions were answered, and informed consent                            was obtained. Prior Anticoagulants: The patient has                            taken no previous anticoagulant or antiplatelet                            agents. ASA Grade Assessment: II - A patient with                            mild systemic disease. After reviewing the risks                            and benefits, the patient was deemed in                            satisfactory condition to undergo the procedure.  After obtaining informed consent, the colonoscope                            was passed under direct vision. Throughout the                            procedure, the patient's blood pressure, pulse, and                            oxygen saturations were monitored continuously. The                            Model CF-HQ190L [id number]) scope was introduced                            through the anus and advanced to the  the terminal                            ileum. The colonoscopy was performed without                            difficulty. The patient tolerated the procedure                            well. The quality of the bowel preparation was                            good. The terminal ileum, ileocecal valve,                            appendiceal orifice, and rectum were photographed. Scope In: 11:10:41 AM Scope Out: 11:21:42 AM Scope Withdrawal Time: 0 hours 7 minutes 27 seconds  Total Procedure Duration: 0 hours 11 minutes 1 second  Findings:                 The terminal ileum appeared normal.                           The entire examined colon appeared normal on direct                            and retroflexion views.                           Biopsies for histology were taken with a cold                            forceps from the entire colon for evaluation of                            microscopic colitis. Complications:            No immediate complications. Estimated blood loss:  None. Estimated Blood Loss:     Estimated blood loss: none. Impression:               - The examined portion of the ileum was normal.                           - The entire examined colon is normal on direct and                            retroflexion views.                           - Biopsies were taken with a cold forceps from the                            entire colon for evaluation of microscopic colitis. Recommendation:           - EGD now.                           - Await pathology results. Rachael Feeaniel P Jacobs, MD 10/29/2017 11:24:21 AM This report has been signed electronically.

## 2017-10-29 NOTE — Patient Instructions (Signed)
YOU HAD AN ENDOSCOPIC PROCEDURE TODAY AT THE [facility]:   Refer to the procedure report that was given to you for any specific questions about what was found during the examination.  If the procedure report does not answer your questions, please call your gastroenterologist to clarify.  If you requested that your care partner not be given the details of your procedure findings, then the procedure report has been included in a sealed envelope for you to review at your convenience later.  YOU SHOULD EXPECT: Some feelings of bloating in the abdomen. Passage of more gas than usual.  Walking can help get rid of the air that was put into your GI tract during the procedure and reduce the bloating. If you had a lower endoscopy (such as a colonoscopy or flexible sigmoidoscopy) you may notice spotting of blood in your stool or on the toilet paper. If you underwent a bowel prep for your procedure, you may not have a normal bowel movement for a few days.  Please Note:  You might notice some irritation and congestion in your nose or some drainage.  This is from the oxygen used during your procedure.  There is no need for concern and it should clear up in a day or so.  SYMPTOMS TO REPORT IMMEDIATELY:   Following lower endoscopy (colonoscopy or flexible sigmoidoscopy):  Excessive amounts of blood in the stool  Significant tenderness or worsening of abdominal pains  Swelling of the abdomen that is new, acute  Fever of 100F or higher   Following upper endoscopy (EGD)  Vomiting of blood or coffee ground material  New chest pain or pain under the shoulder blades  Painful or persistently difficult swallowing  New shortness of breath  Fever of 100F or higher  Black, tarry-looking stools  For urgent or emergent issues, a gastroenterologist can be reached at any hour by calling [phone].   DIET:  We do recommend a small meal at first, but then you may proceed to your regular diet.  Drink  plenty of fluids but you should avoid alcoholic beverages for 24 hours.  ACTIVITY:  You should plan to take it easy for the rest of today and you should NOT DRIVE or use heavy machinery until tomorrow (because of the sedation medicines used during the test).    FOLLOW UP: Our staff will call the number listed on your records the next business day following your procedure to check on you and address any questions or concerns that you may have regarding the information given to you following your procedure. If we do not reach you, we will leave a message.  However, if you are feeling well and you are not experiencing any problems, there is no need to return our call.  We will assume that you have returned to your regular daily activities without incident.  If any biopsies were taken you will be contacted by phone or by letter within the next 1-3 weeks.  Please call us at [phone] if you have not heard about the biopsies in 3 weeks.   Read all of the handouts given to you by your recovery room nurse.   SIGNATURES/CONFIDENTIALITY: You and/or your care partner have signed paperwork which will be entered into your electronic medical record.  These signatures attest to the fact that that the information above on your After Visit Summary has been reviewed and is understood.  Full responsibility of the confidentiality of this discharge information lies with you and/or  your care-partner. 

## 2017-10-29 NOTE — Op Note (Signed)
[facility] Patient Name: Cynthia Mendez Procedure Date: 10/29/2017 11:00 AM MRN: [id number] Endoscopist: Rachael Fee , MD Age: 37 Referring MD:  Date of Birth: 1981/03/13 Gender: Female Account #: [id number] Procedure:                Upper GI endoscopy Indications:              Generalized abdominal pain, acute intermittent with                            vomiting and distention. Medicines:                Monitored Anesthesia Care Procedure:                Pre-Anesthesia Assessment:                           - Prior to the procedure, a History and Physical                            was performed, and patient medications and                            allergies were reviewed. The patient's tolerance of                            previous anesthesia was also reviewed. The risks                            and benefits of the procedure and the sedation                            options and risks were discussed with the patient.                            All questions were answered, and informed consent                            was obtained. Prior Anticoagulants: The patient has                            taken no previous anticoagulant or antiplatelet                            agents. ASA Grade Assessment: II - A patient with                            mild systemic disease. After reviewing the risks                            and benefits, the patient was deemed in                            satisfactory condition to undergo the procedure.  After obtaining informed consent, the endoscope was                            passed under direct vision. Throughout the                            procedure, the patient's blood pressure, pulse, and                            oxygen saturations were monitored continuously. The                            Endoscope was introduced through the mouth, and                            advanced to the second part  of duodenum. The upper                            GI endoscopy was accomplished without difficulty.                            The patient tolerated the procedure well. Scope In: Scope Out: Findings:                 The esophagus was normal.                           Mild inflammation characterized by erythema,                            friability and granularity was found in the gastric                            antrum. Biopsies were taken with a cold forceps for                            histology.                           The examined duodenum was normal. Biopsies were                            taken with a cold forceps for histology. Complications:            No immediate complications. Estimated blood loss:                            None. Estimated Blood Loss:     Estimated blood loss: none. Impression:               - Normal esophagus.                           - Mild gastritis, biopsied to check for H. pylori.                           -  Normal examined duodenum. Biopsied. Recommendation:           - Patient has a contact number available for                            emergencies. The signs and symptoms of potential                            delayed complications were discussed with the                            patient. Return to normal activities tomorrow.                            Written discharge instructions were provided to the                            patient.                           - Resume previous diet.                           - Continue present medications.                           - Await pathology results. Rachael Feeaniel P Norberto Wishon, MD 10/29/2017 11:37:19 AM This report has been signed electronically.

## 2017-10-29 NOTE — Progress Notes (Signed)
To PACU, VSS. Report to [email or web address] 

## 2017-10-30 ENCOUNTER — Telehealth: Payer: Self-pay

## 2017-10-30 NOTE — Telephone Encounter (Signed)
  Follow up Call-  Call back number 10/29/2017  Post procedure Call Back phone  # [phone]  Permission to leave phone message Yes  Some recent data might be hidden     Patient questions:  Do you have a fever, pain , or abdominal swelling? No. Pain Score  0 *  Have you tolerated food without any problems? Yes.    Have you been able to return to your normal activities? Yes.    Do you have any questions about your discharge instructions: Diet   No. Medications  No. Follow up visit  No.  Do you have questions or concerns about your Care? No.  Actions: * If pain score is 4 or above: No action needed, pain <4.

## 2017-11-05 ENCOUNTER — Encounter: Payer: Self-pay | Admitting: Gastroenterology

## 2017-12-12 ENCOUNTER — Encounter: Payer: Self-pay | Admitting: Physician Assistant

## 2017-12-12 ENCOUNTER — Ambulatory Visit: Payer: 59 | Admitting: Physician Assistant

## 2017-12-12 VITALS — BP 110/74 | HR 68 | Ht 63.5 in | Wt 203.5 lb

## 2017-12-12 DIAGNOSIS — R140 Abdominal distension (gaseous): Secondary | ICD-10-CM | POA: Diagnosis not present

## 2017-12-12 DIAGNOSIS — R1084 Generalized abdominal pain: Secondary | ICD-10-CM | POA: Diagnosis not present

## 2017-12-12 DIAGNOSIS — K6389 Other specified diseases of intestine: Secondary | ICD-10-CM

## 2017-12-12 DIAGNOSIS — K219 Gastro-esophageal reflux disease without esophagitis: Secondary | ICD-10-CM | POA: Diagnosis not present

## 2017-12-12 DIAGNOSIS — R112 Nausea with vomiting, unspecified: Secondary | ICD-10-CM

## 2017-12-12 MED ORDER — RIFAXIMIN 550 MG PO TABS: 550.0000 mg | ORAL_TABLET | Freq: Three times a day (TID) | ORAL | 0 refills | Status: DC

## 2017-12-12 MED ORDER — PANTOPRAZOLE SODIUM 40 MG PO TBEC: 40.0000 mg | DELAYED_RELEASE_TABLET | Freq: Two times a day (BID) | ORAL | 3 refills | Status: DC

## 2017-12-12 MED ORDER — DICYCLOMINE HCL 20 MG PO TABS: 20.0000 mg | ORAL_TABLET | Freq: Two times a day (BID) | ORAL | 2 refills | Status: DC

## 2017-12-12 NOTE — Progress Notes (Signed)
Chief Complaint: Severe abdominal pain and bloating  HPI:    Cynthia Mendez is a 37 year old female with a past medical history as listed below including status post hysterectomy and cholecystectomy, assigned to Dr. Ardis Hughs at last office visit, who resents to clinic today with a complaint of "severe abdominal pain and bloating" .      10/25/2017 office visit with me for abdominal pain, bloating, nausea and vomiting and change in bowel habits.  Had been to the ED with CT showing only minimal ventral hernia and no other abnormality.  CMP, CBC and lipase were within normal limits.  At that visit her husband accompanied her and they described a "soy allergy.  Though over the past 3 to 4 months avoiding so he did not avoid symptoms.  That time described chest tightness and pain feeling like she could not breathe or severe abdominal swelling.  As well as nausea and vomiting sometimes diarrhea.  Call 911 out to the house on multiple occasions.  See my extensive office visit note from that day.  At that time arrange for EGD and colonoscopy.  Also ordered labs to consider celiac disease and refill dicyclomine 20 mg as well as Zofran.    10/29/2017 EGD and colonoscopy.  EGD with mild gastritis.  Otherwise normal.  Biopsies show reactive gastropathy and slight chronic inflammation.  Colonoscopy was normal.  Pathology showed no significant abnormality.    Today, explains that she continues with the same symptoms.  Develops severe abdominal pain and bloating sometimes minutes after swallowing food.  Does identify some trigger foods which make this worse.  When she has a "reaction" she will take a Benadryl, Prednisone and multiple Gas-X and sometimes get in the hot tub which seems to relieve her pain after a while.  Continues with excessive belching but seems "unable to pass gas".  Bowel movements are typically 3-4 every couple of days.  Tells me the "acid in my stomach is worse than normal" continues her Omeprazole 40 mg  daily.  Her "nausea is not as bad".    Denies fever, chills, blood in her stool, weight loss or symptoms that awaken her from sleep.  Past Medical History:  Diagnosis Date  . GERD (gastroesophageal reflux disease)     Past Surgical History:  Procedure Laterality Date  . ABDOMINAL HYSTERECTOMY    . CHOLECYSTECTOMY      Current Outpatient Medications  Medication Sig Dispense Refill  . Azelastine-Fluticasone (DYMISTA) 137-50 MCG/ACT SUSP Place 1 spray into both nostrils daily as needed (allergies).    . cyclobenzaprine (FLEXERIL) 10 MG tablet Take 10 mg by mouth as needed for muscle spasms.    Marland Kitchen dicyclomine (BENTYL) 20 MG tablet Take 1 tab twice daily. 60 tablet 2  . famotidine (PEPCID) 20 MG tablet Take 1 tablet (20 mg total) by mouth 2 (two) times daily. 30 tablet 0  . HYDROcodone-acetaminophen (NORCO/VICODIN) 5-325 MG tablet Take 1-2 tablets by mouth every 6 (six) hours as needed. (Patient not taking: Reported on 10/29/2017) 10 tablet 0  . loratadine (CLARITIN) 10 MG tablet Take 10 mg by mouth daily as needed for allergies.    . Olopatadine HCl (PATADAY) 0.2 % SOLN Place 1 drop into both eyes daily as needed (allergies).    Marland Kitchen omeprazole (PRILOSEC) 40 MG capsule Take 40 mg by mouth daily.    . ondansetron (ZOFRAN) 4 MG tablet Take 1 tab every 4-6 hours as needed for nausea. 30 tablet 2   Current Facility-Administered Medications  Medication Dose Route Frequency Provider Last Rate Last Dose  . 0.9 %  sodium chloride infusion  500 mL Intravenous Once Milus Banister, MD        Allergies as of 12/12/2017 - Review Complete 10/29/2017  Allergen Reaction Noted  . Isovue [iopamidol] Hives and Itching 10/18/2017  . Morphine and related  10/18/2017  . Other  10/18/2017  . Soy allergy  10/18/2017    Family History  Problem Relation Age of Onset  . Irritable bowel syndrome Maternal Grandmother     Social History   Socioeconomic History  . Marital status: Married    Spouse name: Not  on file  . Number of children: Not on file  . Years of education: Not on file  . Highest education level: Not on file  Occupational History  . Not on file  Social Needs  . Financial resource strain: Not on file  . Food insecurity:    Worry: Not on file    Inability: Not on file  . Transportation needs:    Medical: Not on file    Non-medical: Not on file  Tobacco Use  . Smoking status: Never Smoker  . Smokeless tobacco: Never Used  Substance and Sexual Activity  . Alcohol use: No  . Drug use: No  . Sexual activity: Not on file  Lifestyle  . Physical activity:    Days per week: Not on file    Minutes per session: Not on file  . Stress: Not on file  Relationships  . Social connections:    Talks on phone: Not on file    Gets together: Not on file    Attends religious service: Not on file    Active member of club or organization: Not on file    Attends meetings of clubs or organizations: Not on file    Relationship status: Not on file  . Intimate partner violence:    Fear of current or ex partner: Not on file    Emotionally abused: Not on file    Physically abused: Not on file    Forced sexual activity: Not on file  Other Topics Concern  . Not on file  Social History Narrative  . Not on file    Review of Systems:    Constitutional: No weight loss, fever or chills Cardiovascular: No chest pain  Respiratory: No SOB  Gastrointestinal: See HPI and otherwise negative   Physical Exam:  Vital signs: BP 110/74 (BP Location: Left Arm, Patient Position: Sitting, Cuff Size: Normal)   Pulse 68   Ht 5' 3.5" (1.613 m)   Wt 203 lb 8 oz (92.3 kg)   BMI 35.48 kg/m   Constitutional:   Pleasant overweight Caucasian female appears to be in NAD, Well developed, Well nourished, alert and cooperative Respiratory: Respirations even and unlabored. Lungs clear to auscultation bilaterally.   No wheezes, crackles, or rhonchi.  Cardiovascular: Normal S1, S2. No MRG. Regular rate and  rhythm. No peripheral edema, cyanosis or pallor.  Gastrointestinal:  Soft, nondistended, nontender. No rebound or guarding. Normal bowel sounds. No appreciable masses or hepatomegaly. Psychiatric:  Demonstrates good judgement and reason without abnormal affect or behaviors.  See HPI for most recent labs and imaging.  Assessment: 1.  Abdominal pain: Generalized, worse after eating and "bloating", sometimes relieved with Benadryl, Prednisone, hot tub and Gas-X, recent EGD with only mild gastritis and normal colonoscopy, CT in the past normal, labs including CBC, CMP, lipase and urinalysis normal; most likely IBS, question  SIBO 2.  Change in bowel habits: With episodes above, some diarrhea 3.  Nausea and vomiting: Patient admits this is not as bad 4.  Heartburn: Worse over the past couple of weeks on Omeprazole 40 mg daily  Plan: 1.  We will give patient trial of Xifaxan 500 mg 3 times daily 2 weeks for suspected SIBO 2.  Stop Omeprazole, start Pantoprazole 40 mg twice daily, 30-60 minutes before eating breakfast and dinner #60 with 3 refills 3.  Refilled Dicyclomine 20 mg twice daily.  In the future could consider scheduling this out 4.  Consider having patient see another allergist 5.  Discussed fiber and probiotic, patient has tried these in the past and they made her symptoms worse 6.  Patient to follow in clinic with Dr. Ardis Hughs, as she has not met him yet in clinic, in the next 6-8 weeks.  Cynthia Newer, PA-C [facility] 12/12/2017, 8:49 AM  Cc: Colonel Bald, MD

## 2017-12-12 NOTE — Patient Instructions (Signed)
We have sent your demographic information and a prescription for Xifaxan to [facility]. This pharmacy is able to get medication approved through insurance and get you the lowest copay possible. If you have not heard from them within 1 week, please call our office at [phone] to let us know.  Stop Omeprazole   Start Pantoprazole 40 mg twice a day 30-60 minutes before breakfast and dinner.   Refilled Dicyclomine 20 mg twice a day

## 2017-12-20 ENCOUNTER — Telehealth: Payer: Self-pay | Admitting: Physician Assistant

## 2017-12-20 NOTE — Telephone Encounter (Signed)
Left message on patients voicemail to call office.   

## 2017-12-20 NOTE — Telephone Encounter (Signed)
Pt inquiring about an antibiotic that was going to be shipped to her house. It has been over a week and she has not received anything.

## 2018-01-20 ENCOUNTER — Telehealth: Payer: Self-pay | Admitting: Gastroenterology

## 2018-01-20 NOTE — Telephone Encounter (Signed)
Left message on machine to call back  

## 2018-01-21 MED ORDER — RIFAXIMIN 550 MG PO TABS: 550.0000 mg | ORAL_TABLET | Freq: Three times a day (TID) | ORAL | 0 refills | Status: DC

## 2018-01-21 NOTE — Telephone Encounter (Signed)
We can try another round. If this does not last longer than we probably need to see her again to discuss further. Thanks-JLL

## 2018-01-21 NOTE — Telephone Encounter (Signed)
On Protonix, bentyl, and completed xifaxan 8/16.  Felt better until a week ago and bloating and reflux has returned.  Not as bad as before but has symptoms most days.  She wants to know if she needs another round of xifaxan.  Please advise.

## 2018-01-21 NOTE — Telephone Encounter (Signed)
The pt was advised and will call if she does not improve after stopping abx.  Prescription sent

## 2018-11-13 IMAGING — CT CT ABD-PELV W/ CM
2 of 4 series · 16 of 46 positions shown, 18 images · IV contrast (APPLIED)
Comparison: None.

CLINICAL DATA: Abdominal pain with nausea and vomiting

EXAM:
CT ABDOMEN AND PELVIS WITH CONTRAST
TECHNIQUE: Multidetector CT imaging of the abdomen and pelvis was performed
using the standard protocol following bolus administration of
intravenous contrast.

[Series 2: axial st · axial · 0.79mm/px · z∈[-416,-26]mm · 13 of 86 slices shown, 15 images]
[im 4/86  soft-tissue]
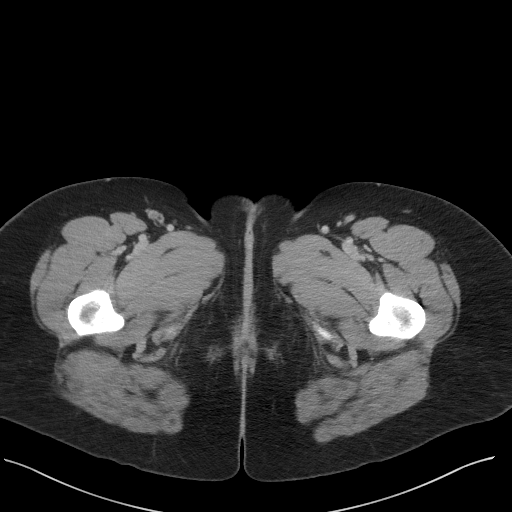
[im 4/86  bone]
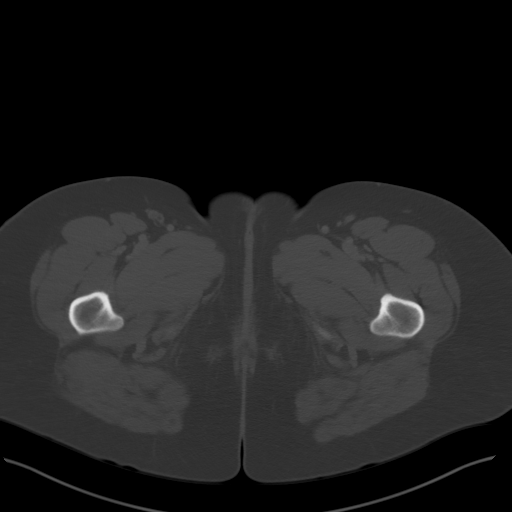
[im 10/86  soft-tissue]
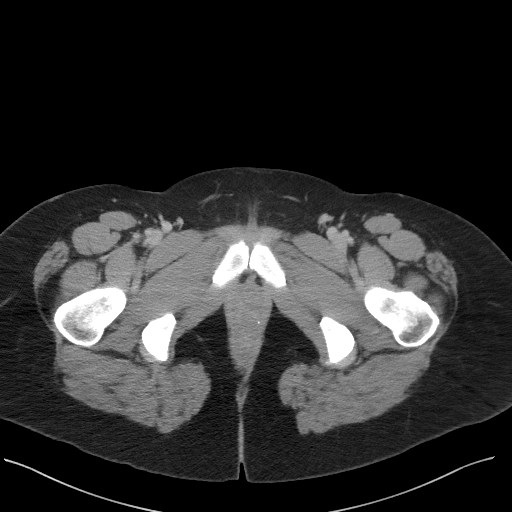
[im 17/86  soft-tissue]
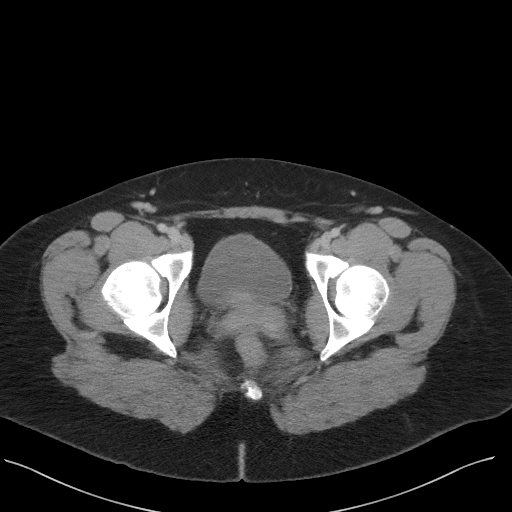
[im 23/86  soft-tissue]
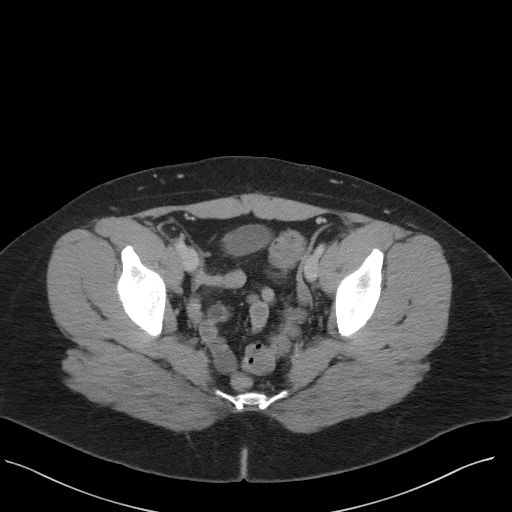
[im 30/86  soft-tissue]
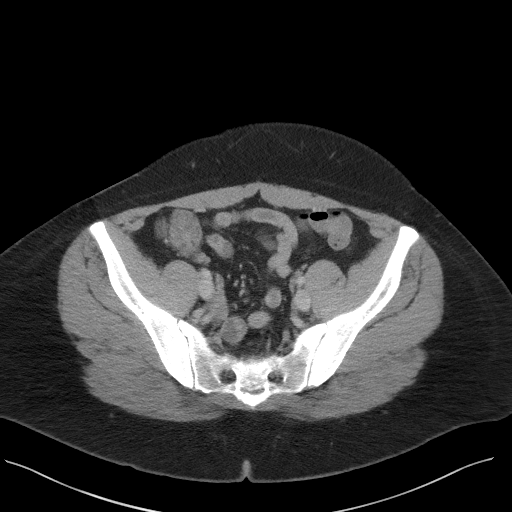
[im 36/86  soft-tissue]
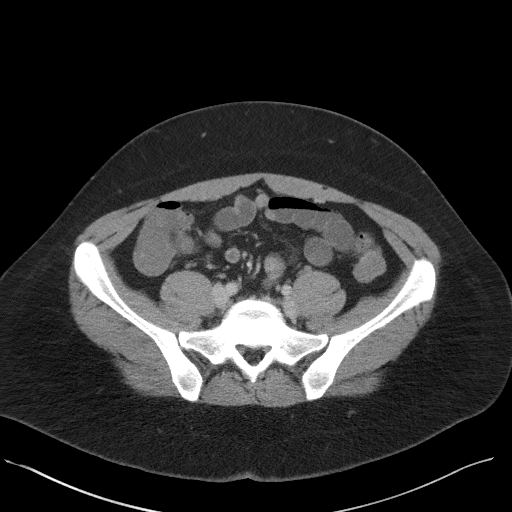
[im 43/86  soft-tissue]
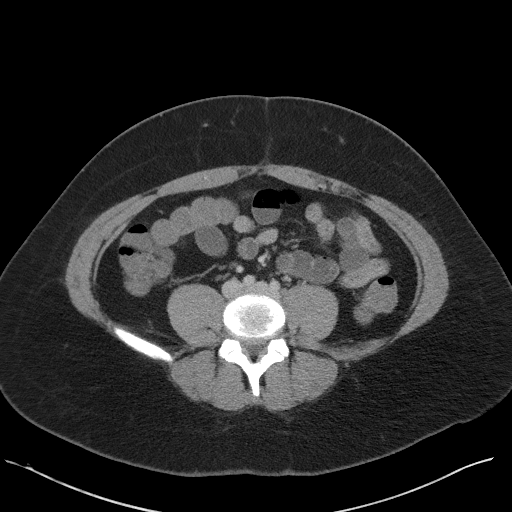
[im 50/86  soft-tissue]
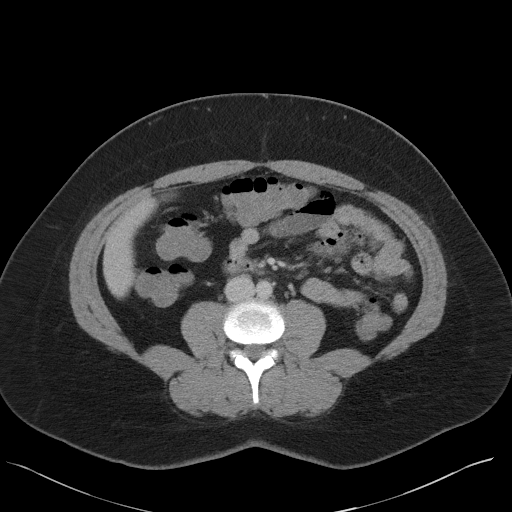
[im 56/86  soft-tissue]
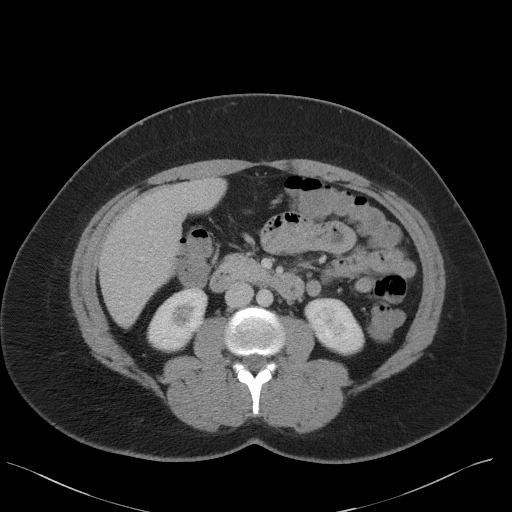
[im 56/86  bone]
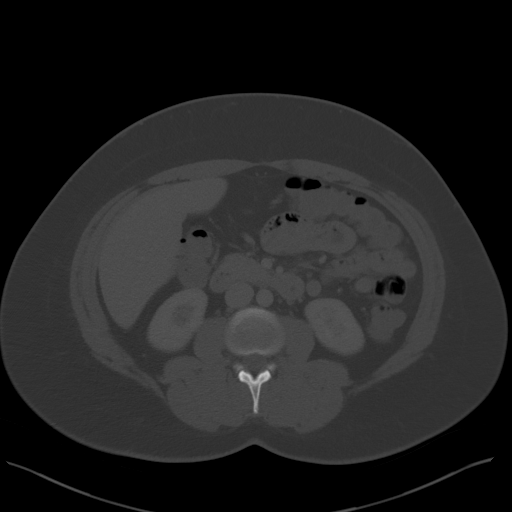
[im 63/86  soft-tissue]
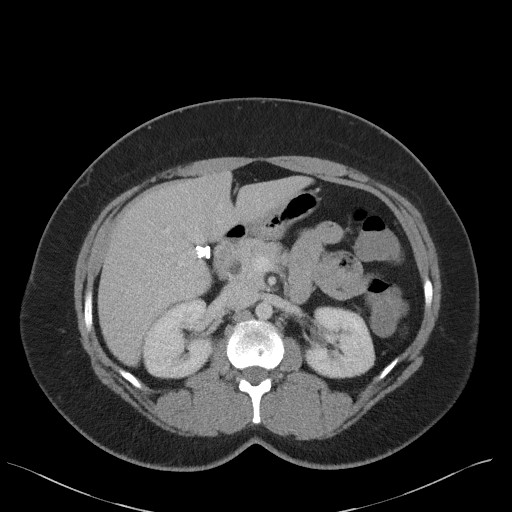
[im 69/86  soft-tissue]
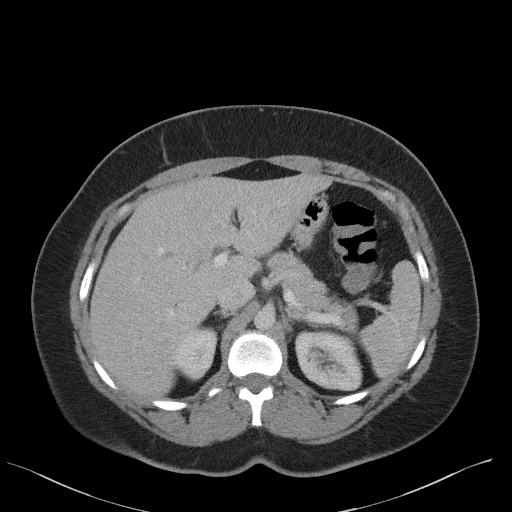
[im 76/86  soft-tissue]
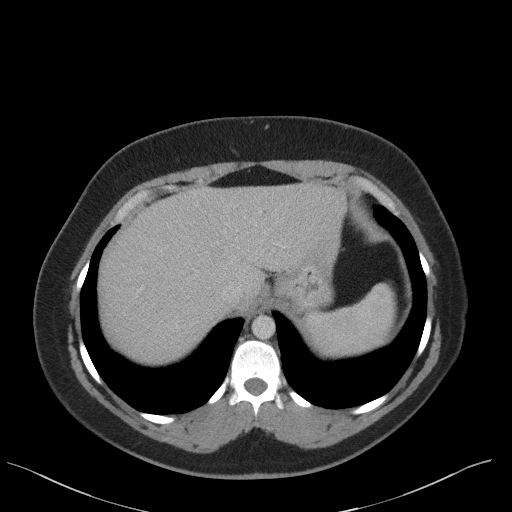
[im 82/86  soft-tissue]
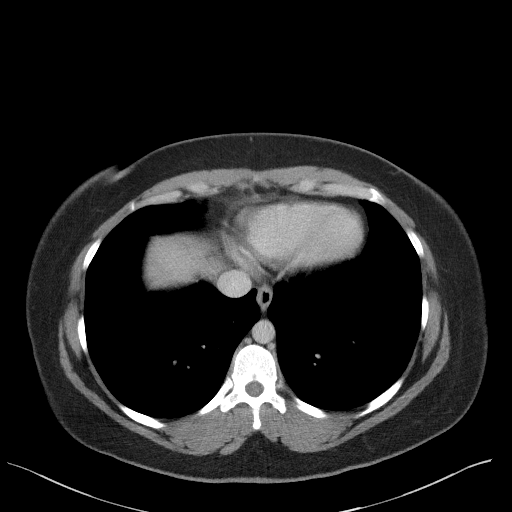

[Series 5: coronal st · coronal · 0.83mm/px · 3 of 101 slices shown]
[im 34/101  soft-tissue]
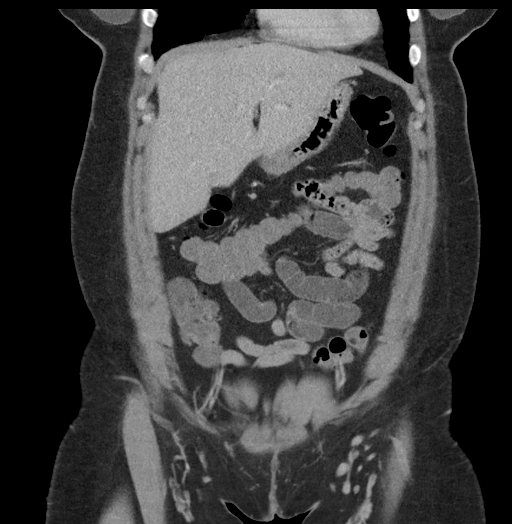
[im 45/101  soft-tissue]
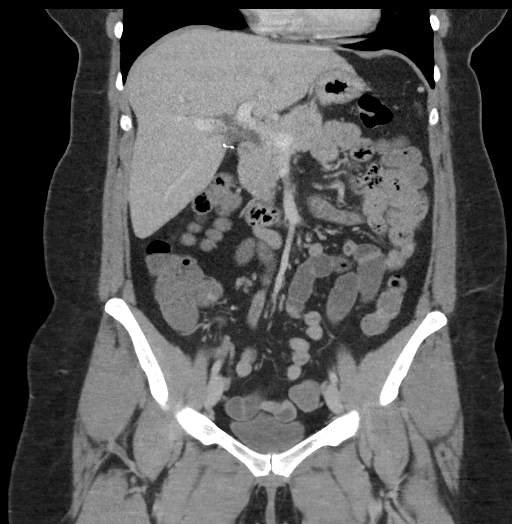
[im 56/101  soft-tissue]
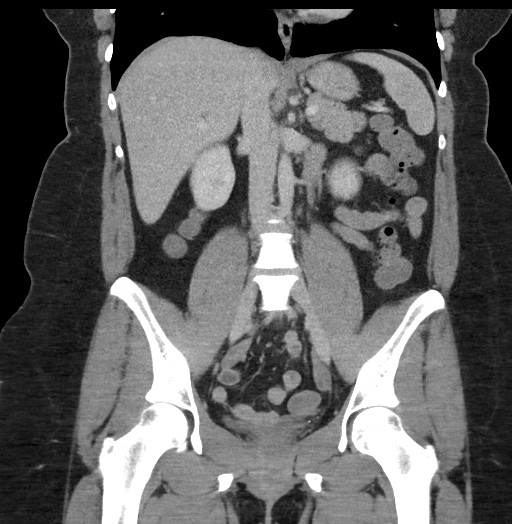

[16 of 46 positions shown; findings below may reference images not displayed]

The patient reportedly developed pruritus and mild urticaria after
the contrast injection. The patient was reportedly treated by the
emergency department physician on site. This episode constitutes an
allergic reaction to iodinated contrast material.

CONTRAST:  100mL BG0QLF-0MM IOPAMIDOL (BG0QLF-0MM) INJECTION 61%
FINDINGS: Lower chest: Lung bases are clear.

Hepatobiliary: No focal liver lesions are evident. Gallbladder is
absent. There is mild biliary duct dilatation. There is mild
prominence of the common hepatic duct with tapering distally. No
biliary duct mass or calculus evident.

Pancreas: There is no pancreatic mass or inflammatory focus.

Spleen: No splenic lesions are evident.

Adrenals/Urinary Tract: Adrenals bilaterally appear normal. Kidneys
bilaterally show no evident mass or hydronephrosis on either side.
There is no renal or ureteral calculus on either side. Urinary
bladder is midline with wall thickness within normal limits.

Stomach/Bowel: There is no appreciable bowel wall or mesenteric
thickening. There is no demonstrable bowel obstruction. No free air
or portal venous air.

Vascular/Lymphatic: There is no abdominal aortic aneurysm. No
vascular lesions evident. No adenopathy is appreciable in the
abdomen or pelvis.

Reproductive: Uterus is absent.  There is no evident pelvic mass.

Other: Appendix appears normal. There is no ascites or abscess in
the abdomen or pelvis. There is a rather minimal ventral hernia
containing only fat.

Musculoskeletal: There are no blastic or lytic bone lesions. There
is no intramuscular or abdominal wall lesion.
IMPRESSION: 1. Patient experienced allergic reaction to iodinated contrast
material administration. Patient received treatment for this
allergic reaction which included parotiditis and mild urticaria in
the emergency department on site.

2. No evident bowel obstruction. No abscess. Appendix appears
normal.

3.  No renal or ureteral calculus.  No hydronephrosis.

4. Chronic intrahepatic biliary duct dilatation following
cholecystectomy. No focal liver lesions. No biliary duct mass or
calculus evident.

5.  Uterus absent.

6.  Rather minimal ventral hernia containing only fat.

## 2018-12-22 ENCOUNTER — Other Ambulatory Visit: Payer: Self-pay | Admitting: Physician Assistant

## 2018-12-22 ENCOUNTER — Telehealth: Payer: Self-pay | Admitting: Gastroenterology

## 2018-12-22 MED ORDER — PANTOPRAZOLE SODIUM 40 MG PO TBEC: 40.0000 mg | DELAYED_RELEASE_TABLET | Freq: Two times a day (BID) | ORAL | 0 refills | Status: DC

## 2018-12-22 NOTE — Telephone Encounter (Signed)
The pt had abd swelling, distention, vomiting, SOB, wheezing.  Feels like spasms.  Has had 3 episodes over the past month.  She says she has to vomit to ease the pain.  Has had these in the past and took xifixan and bentyl that helped and had no problems since last office visit with Anderson Malta in 2019. She stopped her PPI.  Still uses bentyl that helps.  Appt made with Nevin Bloodgood for 8/12.  She will call if needed prior to that appt.  She also asked for a refill of protonix.  I have sent 1 month.

## 2018-12-22 NOTE — Telephone Encounter (Signed)
Patient called said that she is vomiting and feels like her stomach get swollen when she eats.

## 2019-01-07 ENCOUNTER — Other Ambulatory Visit: Payer: Self-pay

## 2019-01-07 ENCOUNTER — Encounter: Payer: Self-pay | Admitting: Nurse Practitioner

## 2019-01-07 ENCOUNTER — Ambulatory Visit: Payer: 59 | Admitting: Nurse Practitioner

## 2019-01-07 VITALS — BP 104/62 | HR 66 | Temp 97.9°F | Ht 63.5 in | Wt 191.0 lb

## 2019-01-07 DIAGNOSIS — K219 Gastro-esophageal reflux disease without esophagitis: Secondary | ICD-10-CM

## 2019-01-07 DIAGNOSIS — R112 Nausea with vomiting, unspecified: Secondary | ICD-10-CM

## 2019-01-07 DIAGNOSIS — R140 Abdominal distension (gaseous): Secondary | ICD-10-CM | POA: Diagnosis not present

## 2019-01-07 DIAGNOSIS — R1012 Left upper quadrant pain: Secondary | ICD-10-CM

## 2019-01-07 MED ORDER — HYOSCYAMINE SULFATE 0.125 MG SL SUBL: 0.1250 mg | SUBLINGUAL_TABLET | Freq: Three times a day (TID) | SUBLINGUAL | 0 refills | Status: AC | PRN

## 2019-01-07 MED ORDER — RIFAXIMIN 550 MG PO TABS: 550.0000 mg | ORAL_TABLET | Freq: Three times a day (TID) | ORAL | 0 refills | Status: AC

## 2019-01-07 NOTE — Progress Notes (Signed)
Chief Complaint:    Abdominal bloating / distention, belching, upper abdominal pain, vomiting, heartburn.   IMPRESSION and PLAN:    1, 38 yo female with chronic intermittent abdominal bloating  / distention / vomiting / LUQ pain. Transient improvement with Xifaxan a year ago. She is interested in another course of Xifaxan. EGD and colonoscopy, both with biopsies negative last years. CTAP w/ contrast in May 2019 also negative. Symptoms may be functional. SIBO not excluded but given the very intermittent nature of symptoms it seems less likely.  -Will repeat a course of Xifaxan. If insurance will not pay then consider course of flagyl vrs just proceeding with breath testing for SIBO.   -recommended strict food diary to try to look for any common denominators which could be culprit.  -GIven the very intermittent nature of symptoms will stop scheduled BID bentyl and try SL levsin as needed. If symptom frequency increases she can always go back to scheduled bentyl.  -Follow up in ~ 3 months  2, GERD, asymptomatic on BID PPI. At some point would like to see her need less or deescalate to H2 blocker.     HPI:     Patient is a 38 yo female seen twice by Ellouise Newer, P.A. July 2019 for about complaints. She had seen allergist a few months prior and discovered several allergies  / intolerances including,  but not limited to soy, tree nuts, tuna, and pinto beans. Avoidance of these food items helped but didn't alleviate GI symptoms. She had an unremarkable CTAP w/ contrast in May 2019. EGD with biopsies and colonoscopy with random biopsies were basically unremarkable except for mild gastritis (no h.pylori). At post-procedure follow up visit we treated empirically for SIBO with Xifaxan. For GERD she was started on BID protonix and she was continued on bid bentyl.   Cynthia Mendez is back with recurrent intermittent bloating / distention / LUQ pain / heartburn / vomiting. Symptoms started back shortly  after completion of Xifaxan. She called office, we recommended a repeat course of Xifaxan but insurance denied it. Since March her symptoms have gotten worse. Now having ~ 2 episodes a month. She had been off Protonix and Bentyl for a while but resumed both of these ~ 3 weeks ago. She was having heartburn but it has improved with resumption of PPI.  Episodes of belching / bloating / distention / LUQ pain / vomiting always occur after eating but she doesn't know which foods cause it. She avoids so many foods and basically following a keto diet now. Weight is down 12 pounds since last July.  In between episodes she feels okay. Sometimes able to stave off a bad episode by taking benadryl and pepto but benadryl makes her so sleepy. If induces vomiting with finger then this helps lessen the severity / duration of episodes. Her bowel movements are at baseline. Generally has a BM QOD      Review of systems:     no SOB, no fevers, no urinary sx   Past Medical History:  Diagnosis Date  . GERD (gastroesophageal reflux disease)     Patient's surgical history, family medical history, social history, medications and allergies were all reviewed in Epic   Creatinine clearance cannot be calculated (Patient's most recent lab result is older than the maximum 21 days allowed.)  Current Outpatient Medications  Medication Sig Dispense Refill  . Azelastine-Fluticasone (DYMISTA) 137-50 MCG/ACT SUSP Place 1 spray into both nostrils daily as needed (allergies).    Marland Kitchen  cyclobenzaprine (FLEXERIL) 10 MG tablet Take 10 mg by mouth as needed for muscle spasms.    Marland Kitchen. dicyclomine (BENTYL) 20 MG tablet Take 1 tab twice daily. 60 tablet 2  . dicyclomine (BENTYL) 20 MG tablet Take 1 tablet by mouth twice daily 60 tablet 0  . HYDROcodone-acetaminophen (NORCO/VICODIN) 5-325 MG tablet Take 1-2 tablets by mouth every 6 (six) hours as needed. (Patient not taking: Reported on 12/12/2017) 10 tablet 0  . loratadine (CLARITIN) 10 MG tablet  Take 10 mg by mouth daily as needed for allergies.    . Olopatadine HCl (PATADAY) 0.2 % SOLN Place 1 drop into both eyes daily as needed (allergies).    . ondansetron (ZOFRAN) 4 MG tablet Take 1 tab every 4-6 hours as needed for nausea. 30 tablet 2  . pantoprazole (PROTONIX) 40 MG tablet Take 1 tablet (40 mg total) by mouth 2 (two) times daily before a meal. 60 tablet 3   Current Facility-Administered Medications  Medication Dose Route Frequency Provider Last Rate Last Dose  . 0.9 %  sodium chloride infusion  500 mL Intravenous Once Rachael FeeJacobs, Daniel P, MD        Physical Exam:     Temp 97.9 F (36.6 C)   Wt 191 lb (86.6 kg)   BMI 33.30 kg/m   GENERAL:  Pleasant female in NAD PSYCH: : Cooperative, normal affect EENT:  conjunctiva pink, mucous membranes moist, neck supple without masses CARDIAC:  RRR, no murmur heard, no peripheral edema PULM: Normal respiratory effort, lungs CTA bilaterally, no wheezing ABDOMEN:  Nondistended, soft, nontender. No obvious masses, no hepatomegaly,  normal bowel sounds SKIN:  turgor, no lesions seen Musculoskeletal:  Normal muscle tone, normal strength NEURO: Alert and oriented x 3, no focal neurologic deficits   Cynthia Mendez , NP 01/07/2019, 8:45 AM

## 2019-01-07 NOTE — Patient Instructions (Signed)
If you are age 36 or older, your body mass index should be between 23-30. Your Body mass index is 33.3 kg/m. If this is out of the aforementioned range listed, please consider follow up with your Primary Care Provider.  If you are age 61 or younger, your body mass index should be between 19-25. Your Body mass index is 33.3 kg/m. If this is out of the aformentioned range listed, please consider follow up with your Primary Care Provider.   We have sent the following medications to your pharmacy for you to pick up at your convenience: [facility] will contact you regarding this medication.  STOP BENTYL.  Follow up with Dr. Ardis Hughs in three months.  The schedule is not available at this time.  Please call the office in a few weeks to make an appointment with Dr. Ardis Hughs in November.  Thank you for choosing me and [facility].   Tye Savoy, NP

## 2019-01-08 NOTE — Progress Notes (Signed)
I agree with the above note, plan 

## 2019-01-15 ENCOUNTER — Telehealth: Payer: Self-pay

## 2019-01-15 NOTE — Telephone Encounter (Signed)
Left message on patients voicemail to return my call regarding Xifaxan samples.  Insurance denied coverage.  I have samples available for her to pick up # [id number] lot #[id number] exp 07/2023

## 2019-01-30 ENCOUNTER — Telehealth: Payer: Self-pay

## 2019-01-30 NOTE — Telephone Encounter (Signed)
Spoke with patient again today about picking up her samples of Xifaxan.  She states she did not get my voicemail from 01/15/19.  She states she will pick up samples on 02/03/19.

## 2019-02-25 ENCOUNTER — Other Ambulatory Visit: Payer: Self-pay | Admitting: Physician Assistant

## 2019-03-23 ENCOUNTER — Ambulatory Visit: Payer: Self-pay | Admitting: Allergy

## 2019-03-23 NOTE — Progress Notes (Deleted)
New Patient Note  RE: Cynthia Mendez MRN: [id number] DOB: Jun 11, 1980 Date of Office Visit: 03/23/2019  Referring provider: No ref. provider found Primary care provider: Colonel Bald, MD  Chief Complaint: No chief complaint on file.  History of Present Illness: I had the pleasure of seeing Cynthia Mendez for initial evaluation at the [facility] on 03/23/2019. She is a 38 y.o. female, who is referred here by Paruchuri, Saunders Glance, MD for the evaluation of ***. She is accompanied today by her *** who provided/contributed to the history.   ***  Assessment and Plan: Cynthia Mendez is a 38 y.o. female with: No problem-specific Assessment & Plan notes found for this encounter.  No follow-ups on file.  No orders of the defined types were placed in this encounter.  Lab Orders  No laboratory test(s) ordered today    Other allergy screening: Asthma: {Blank single:19197::"yes","no"} Rhino conjunctivitis: {Blank single:19197::"yes","no"} Food allergy: {Blank single:19197::"yes","no"} Medication allergy: {Blank single:19197::"yes","no"} Hymenoptera allergy: {Blank single:19197::"yes","no"} Urticaria: {Blank single:19197::"yes","no"} Eczema:{Blank single:19197::"yes","no"} History of recurrent infections suggestive of immunodeficency: {Blank single:19197::"yes","no"}  Diagnostics: Spirometry:  Tracings reviewed. Her effort: {Blank single:19197::"Good reproducible efforts.","It was hard to get consistent efforts and there is a question as to whether this reflects a maximal maneuver.","Poor effort, data can not be interpreted."} FVC: ***L FEV1: ***L, ***% predicted FEV1/FVC ratio: ***% Interpretation: {Blank single:19197::"Spirometry consistent with mild obstructive disease","Spirometry consistent with moderate obstructive disease","Spirometry consistent with severe obstructive disease","Spirometry consistent with possible restrictive disease","Spirometry consistent  with mixed obstructive and restrictive disease","Spirometry uninterpretable due to technique","Spirometry consistent with normal pattern","No overt abnormalities noted given today's efforts"}.  Please see scanned spirometry results for details.  Skin Testing: {Blank single:19197::"Select foods","Environmental allergy panel","Environmental allergy panel and select foods","Food allergy panel","None","Deferred due to recent antihistamines use"}. Positive test to: ***. Negative test to: ***.  Results discussed with patient/family.   Past Medical History: There are no active problems to display for this patient.  Past Medical History:  Diagnosis Date  . GERD (gastroesophageal reflux disease)    Past Surgical History: Past Surgical History:  Procedure Laterality Date  . ABDOMINAL HYSTERECTOMY    . CHOLECYSTECTOMY     Medication List:  Current Outpatient Medications  Medication Sig Dispense Refill  . Azelastine-Fluticasone (DYMISTA) 137-50 MCG/ACT SUSP Place 1 spray into both nostrils daily as needed (allergies).    . cyclobenzaprine (FLEXERIL) 10 MG tablet Take 10 mg by mouth as needed for muscle spasms.    . hyoscyamine (LEVSIN SL) 0.125 MG SL tablet Place 1 tablet (0.125 mg total) under the tongue every 8 (eight) hours as needed for cramping (abdominal pain). 40 tablet 0  . loratadine (CLARITIN) 10 MG tablet Take 10 mg by mouth daily as needed for allergies.    . Olopatadine HCl (PATADAY) 0.2 % SOLN Place 1 drop into both eyes daily as needed (allergies).    . pantoprazole (PROTONIX) 40 MG tablet Take 1 tablet by mouth twice daily 90 tablet 0  . rifaximin (XIFAXAN) 550 MG TABS tablet Take 1 tablet (550 mg total) by mouth 3 (three) times daily. 42 tablet 0   No current facility-administered medications for this visit.    Allergies: Allergies  Allergen Reactions  . Isovue [Iopamidol] Hives and Itching    Pt developed itching and tightness in chest after having 100 ml Isovue 300  .  Morphine And Related     PT does not like the way morphine makes her feel   . Other  Tree nut, pintos ,peas, tuna  . Soy Allergy    Social History: Social History   Socioeconomic History  . Marital status: Married    Spouse name: Not on file  . Number of children: Not on file  . Years of education: Not on file  . Highest education level: Not on file  Occupational History  . Not on file  Social Needs  . Financial resource strain: Not on file  . Food insecurity    Worry: Not on file    Inability: Not on file  . Transportation needs    Medical: Not on file    Non-medical: Not on file  Tobacco Use  . Smoking status: Never Smoker  . Smokeless tobacco: Never Used  Substance and Sexual Activity  . Alcohol use: No  . Drug use: No  . Sexual activity: Not on file  Lifestyle  . Physical activity    Days per week: Not on file    Minutes per session: Not on file  . Stress: Not on file  Relationships  . Social Musicianconnections    Talks on phone: Not on file    Gets together: Not on file    Attends religious service: Not on file    Active member of club or organization: Not on file    Attends meetings of clubs or organizations: Not on file    Relationship status: Not on file  Other Topics Concern  . Not on file  Social History Narrative  . Not on file   Lives in a ***. Smoking: *** Occupation: ***  Environmental HistorySurveyor, minerals: Water Damage/mildew in the house: Copywriter, advertising{Blank single:19197::"yes","no"} Carpet in the family room: {Blank single:19197::"yes","no"} Carpet in the bedroom: {Blank single:19197::"yes","no"} Heating: {Blank single:19197::"electric","gas"} Cooling: {Blank single:19197::"central","window"} Pet: {Blank single:19197::"yes ***","no"}  Family History: Family History  Problem Relation Age of Onset  . Irritable bowel syndrome Maternal Grandmother   . Irritable bowel syndrome Maternal Aunt   . Colon cancer Neg Hx   . Stomach cancer Neg Hx   . Pancreatic cancer Neg  Hx    Problem                               Relation Asthma                                   *** Eczema                                *** Food allergy                          *** Allergic rhino conjunctivitis     ***  Review of Systems  Constitutional: Negative for appetite change, chills, fever and unexpected weight change.  HENT: Negative for congestion and rhinorrhea.   Eyes: Negative for itching.  Respiratory: Negative for cough, chest tightness, shortness of breath and wheezing.   Cardiovascular: Negative for chest pain.  Gastrointestinal: Negative for abdominal pain.  Genitourinary: Negative for difficulty urinating.  Skin: Negative for rash.  Allergic/Immunologic: Negative for environmental allergies and food allergies.  Neurological: Negative for headaches.   Objective: There were no vitals taken for this visit. There is no height or weight on file to calculate BMI. Physical Exam  Constitutional: She is oriented  to person, place, and time. She appears well-developed and well-nourished.  HENT:  Head: Normocephalic and atraumatic.  Right Ear: External ear normal.  Left Ear: External ear normal.  Nose: Nose normal.  Mouth/Throat: Oropharynx is clear and moist.  Eyes: Conjunctivae and EOM are normal.  Neck: Neck supple.  Cardiovascular: Normal rate, regular rhythm and normal heart sounds. Exam reveals no gallop and no friction rub.  No murmur heard. Pulmonary/Chest: Effort normal and breath sounds normal. She has no wheezes. She has no rales.  Abdominal: Soft.  Neurological: She is alert and oriented to person, place, and time.  Skin: Skin is warm. No rash noted.  Psychiatric: She has a normal mood and affect. Her behavior is normal.  Nursing note and vitals reviewed.  The plan was reviewed with the patient/family, and all questions/concerned were addressed.  It was my pleasure to see Cynthia Mendez today and participate in her care. Please feel free to contact me with  any questions or concerns.  Sincerely,  Wyline Mood, DO Allergy & Immunology  [facility] office: [phone] [facility] office: [phone] [place] office: [phone]

## 2019-04-13 ENCOUNTER — Ambulatory Visit: Payer: Self-pay | Admitting: Allergy

## 2019-06-25 ENCOUNTER — Other Ambulatory Visit: Payer: Self-pay | Admitting: Physician Assistant

## 2022-04-09 DIAGNOSIS — L209 Atopic dermatitis, unspecified: Secondary | ICD-10-CM | POA: Diagnosis not present

## 2022-05-30 DIAGNOSIS — B342 Coronavirus infection, unspecified: Secondary | ICD-10-CM | POA: Diagnosis not present

## 2022-05-30 DIAGNOSIS — J069 Acute upper respiratory infection, unspecified: Secondary | ICD-10-CM | POA: Diagnosis not present

## 2022-06-16 DIAGNOSIS — H6500 Acute serous otitis media, unspecified ear: Secondary | ICD-10-CM | POA: Diagnosis not present

## 2022-06-21 DIAGNOSIS — L821 Other seborrheic keratosis: Secondary | ICD-10-CM | POA: Diagnosis not present

## 2022-06-21 DIAGNOSIS — L814 Other melanin hyperpigmentation: Secondary | ICD-10-CM | POA: Diagnosis not present

## 2022-06-21 DIAGNOSIS — L209 Atopic dermatitis, unspecified: Secondary | ICD-10-CM | POA: Diagnosis not present

## 2022-06-22 DIAGNOSIS — Z0000 Encounter for general adult medical examination without abnormal findings: Secondary | ICD-10-CM | POA: Diagnosis not present

## 2022-06-22 DIAGNOSIS — R5383 Other fatigue: Secondary | ICD-10-CM | POA: Diagnosis not present

## 2022-06-22 DIAGNOSIS — F411 Generalized anxiety disorder: Secondary | ICD-10-CM | POA: Diagnosis not present

## 2022-06-22 DIAGNOSIS — K219 Gastro-esophageal reflux disease without esophagitis: Secondary | ICD-10-CM | POA: Diagnosis not present

## 2022-06-22 DIAGNOSIS — E538 Deficiency of other specified B group vitamins: Secondary | ICD-10-CM | POA: Diagnosis not present

## 2022-06-22 DIAGNOSIS — T7800XA Anaphylactic reaction due to unspecified food, initial encounter: Secondary | ICD-10-CM | POA: Diagnosis not present

## 2022-06-22 DIAGNOSIS — Z760 Encounter for issue of repeat prescription: Secondary | ICD-10-CM | POA: Diagnosis not present

## 2022-06-22 DIAGNOSIS — J4520 Mild intermittent asthma, uncomplicated: Secondary | ICD-10-CM | POA: Diagnosis not present

## 2022-06-22 DIAGNOSIS — T782XXD Anaphylactic shock, unspecified, subsequent encounter: Secondary | ICD-10-CM | POA: Diagnosis not present

## 2022-07-19 DIAGNOSIS — L719 Rosacea, unspecified: Secondary | ICD-10-CM | POA: Diagnosis not present

## 2022-07-19 DIAGNOSIS — L820 Inflamed seborrheic keratosis: Secondary | ICD-10-CM | POA: Diagnosis not present

## 2022-10-31 ENCOUNTER — Ambulatory Visit: Payer: Self-pay | Admitting: Podiatry

## 2023-03-11 DIAGNOSIS — M5441 Lumbago with sciatica, right side: Secondary | ICD-10-CM | POA: Diagnosis not present

## 2023-04-24 DIAGNOSIS — R110 Nausea: Secondary | ICD-10-CM | POA: Diagnosis not present

## 2023-04-24 DIAGNOSIS — R0602 Shortness of breath: Secondary | ICD-10-CM | POA: Diagnosis not present

## 2023-04-24 DIAGNOSIS — E6609 Other obesity due to excess calories: Secondary | ICD-10-CM | POA: Diagnosis not present

## 2023-04-24 DIAGNOSIS — Z6835 Body mass index (BMI) 35.0-35.9, adult: Secondary | ICD-10-CM | POA: Diagnosis not present

## 2023-06-06 DIAGNOSIS — L249 Irritant contact dermatitis, unspecified cause: Secondary | ICD-10-CM | POA: Diagnosis not present

## 2023-06-06 DIAGNOSIS — Z13220 Encounter for screening for lipoid disorders: Secondary | ICD-10-CM | POA: Diagnosis not present

## 2023-06-06 DIAGNOSIS — Z6834 Body mass index (BMI) 34.0-34.9, adult: Secondary | ICD-10-CM | POA: Diagnosis not present

## 2023-06-06 DIAGNOSIS — J302 Other seasonal allergic rhinitis: Secondary | ICD-10-CM | POA: Diagnosis not present

## 2023-06-06 DIAGNOSIS — E041 Nontoxic single thyroid nodule: Secondary | ICD-10-CM | POA: Diagnosis not present

## 2023-06-06 DIAGNOSIS — R232 Flushing: Secondary | ICD-10-CM | POA: Diagnosis not present

## 2023-06-06 DIAGNOSIS — M545 Low back pain, unspecified: Secondary | ICD-10-CM | POA: Diagnosis not present

## 2023-06-06 DIAGNOSIS — R6882 Decreased libido: Secondary | ICD-10-CM | POA: Diagnosis not present

## 2023-06-06 DIAGNOSIS — Z0000 Encounter for general adult medical examination without abnormal findings: Secondary | ICD-10-CM | POA: Diagnosis not present

## 2023-06-06 DIAGNOSIS — F419 Anxiety disorder, unspecified: Secondary | ICD-10-CM | POA: Diagnosis not present

## 2023-06-06 DIAGNOSIS — R5383 Other fatigue: Secondary | ICD-10-CM | POA: Diagnosis not present

## 2023-06-06 DIAGNOSIS — K219 Gastro-esophageal reflux disease without esophagitis: Secondary | ICD-10-CM | POA: Diagnosis not present

## 2023-06-06 DIAGNOSIS — G8929 Other chronic pain: Secondary | ICD-10-CM | POA: Diagnosis not present

## 2023-06-06 DIAGNOSIS — E66811 Obesity, class 1: Secondary | ICD-10-CM | POA: Diagnosis not present

## 2023-06-12 DIAGNOSIS — E041 Nontoxic single thyroid nodule: Secondary | ICD-10-CM | POA: Diagnosis not present

## 2023-06-12 DIAGNOSIS — E042 Nontoxic multinodular goiter: Secondary | ICD-10-CM | POA: Diagnosis not present

## 2023-06-24 DIAGNOSIS — R7989 Other specified abnormal findings of blood chemistry: Secondary | ICD-10-CM | POA: Diagnosis not present

## 2023-06-24 DIAGNOSIS — Z0000 Encounter for general adult medical examination without abnormal findings: Secondary | ICD-10-CM | POA: Diagnosis not present

## 2023-08-28 DIAGNOSIS — R92333 Mammographic heterogeneous density, bilateral breasts: Secondary | ICD-10-CM | POA: Diagnosis not present

## 2023-08-28 DIAGNOSIS — N644 Mastodynia: Secondary | ICD-10-CM | POA: Diagnosis not present

## 2023-08-28 DIAGNOSIS — N6320 Unspecified lump in the left breast, unspecified quadrant: Secondary | ICD-10-CM | POA: Diagnosis not present

## 2023-08-28 DIAGNOSIS — N6002 Solitary cyst of left breast: Secondary | ICD-10-CM | POA: Diagnosis not present

## 2023-09-27 DIAGNOSIS — Z01818 Encounter for other preprocedural examination: Secondary | ICD-10-CM | POA: Diagnosis not present

## 2024-01-09 DIAGNOSIS — E559 Vitamin D deficiency, unspecified: Secondary | ICD-10-CM | POA: Diagnosis not present

## 2024-01-09 DIAGNOSIS — E042 Nontoxic multinodular goiter: Secondary | ICD-10-CM | POA: Diagnosis not present

## 2024-01-09 DIAGNOSIS — E049 Nontoxic goiter, unspecified: Secondary | ICD-10-CM | POA: Diagnosis not present

## 2024-01-22 DIAGNOSIS — R1013 Epigastric pain: Secondary | ICD-10-CM | POA: Diagnosis not present

## 2024-01-22 DIAGNOSIS — Z79899 Other long term (current) drug therapy: Secondary | ICD-10-CM | POA: Diagnosis not present

## 2024-01-22 DIAGNOSIS — E049 Nontoxic goiter, unspecified: Secondary | ICD-10-CM | POA: Diagnosis not present
